# Patient Record
Sex: Male | Born: 1965
Health system: Southern US, Community
[De-identification: ages and names within clinical notes are randomized; demographics above are authoritative.]

## PROBLEM LIST (undated history)

## (undated) DIAGNOSIS — E785 Hyperlipidemia, unspecified: Secondary | ICD-10-CM

## (undated) DIAGNOSIS — I10 Essential (primary) hypertension: Secondary | ICD-10-CM

## (undated) DIAGNOSIS — R740 Nonspecific elevation of levels of transaminase and lactic acid dehydrogenase [LDH]: Secondary | ICD-10-CM

## (undated) DIAGNOSIS — G43909 Migraine, unspecified, not intractable, without status migrainosus: Secondary | ICD-10-CM

## (undated) DIAGNOSIS — G4730 Sleep apnea, unspecified: Secondary | ICD-10-CM

## (undated) DIAGNOSIS — T7840XA Allergy, unspecified, initial encounter: Secondary | ICD-10-CM

## (undated) DIAGNOSIS — K219 Gastro-esophageal reflux disease without esophagitis: Secondary | ICD-10-CM

## (undated) HISTORY — DX: Migraine, unspecified, not intractable, without status migrainosus: G43.909

## (undated) HISTORY — DX: Gastro-esophageal reflux disease without esophagitis: K21.9

## (undated) HISTORY — DX: Essential (primary) hypertension: I10

## (undated) HISTORY — DX: Hyperlipidemia, unspecified: E78.5

## (undated) HISTORY — PX: OTHER SURGICAL HISTORY: SHX169

## (undated) HISTORY — DX: Sleep apnea, unspecified: G47.30

## (undated) HISTORY — DX: Nonspecific elevation of levels of transaminase and lactic acid dehydrogenase (ldh): R74.0

## (undated) HISTORY — DX: Allergy, unspecified, initial encounter: T78.40XA

---

## 1999-06-09 ENCOUNTER — Emergency Department (HOSPITAL_COMMUNITY): Admission: EM | Admit: 1999-06-09 | Discharge: 1999-06-09 | Payer: Self-pay | Admitting: Emergency Medicine

## 1999-11-14 DIAGNOSIS — K219 Gastro-esophageal reflux disease without esophagitis: Secondary | ICD-10-CM | POA: Insufficient documentation

## 2003-06-12 ENCOUNTER — Emergency Department (HOSPITAL_COMMUNITY): Admission: EM | Admit: 2003-06-12 | Discharge: 2003-06-13 | Payer: Self-pay

## 2003-06-13 ENCOUNTER — Encounter: Payer: Self-pay | Admitting: Emergency Medicine

## 2003-06-16 ENCOUNTER — Emergency Department (HOSPITAL_COMMUNITY): Admission: EM | Admit: 2003-06-16 | Discharge: 2003-06-16 | Payer: Self-pay | Admitting: Emergency Medicine

## 2007-12-04 ENCOUNTER — Ambulatory Visit: Payer: Self-pay | Admitting: Internal Medicine

## 2007-12-04 DIAGNOSIS — G43909 Migraine, unspecified, not intractable, without status migrainosus: Secondary | ICD-10-CM

## 2007-12-04 HISTORY — DX: Migraine, unspecified, not intractable, without status migrainosus: G43.909

## 2007-12-05 ENCOUNTER — Ambulatory Visit: Payer: Self-pay | Admitting: Internal Medicine

## 2007-12-05 LAB — CONVERTED CEMR LAB
ALT: 43 units/L (ref 0–53)
AST: 31 units/L (ref 0–37)
Albumin: 4.2 g/dL (ref 3.5–5.2)
Alkaline Phosphatase: 54 units/L (ref 39–117)
BUN: 15 mg/dL (ref 6–23)
Basophils Absolute: 0 10*3/uL (ref 0.0–0.1)
Basophils Relative: 0.3 % (ref 0.0–1.0)
Bilirubin, Direct: 0.1 mg/dL (ref 0.0–0.3)
CO2: 27 meq/L (ref 19–32)
Calcium: 9.4 mg/dL (ref 8.4–10.5)
Chloride: 105 meq/L (ref 96–112)
Creatinine, Ser: 1.1 mg/dL (ref 0.4–1.5)
Eosinophils Absolute: 0.2 10*3/uL (ref 0.0–0.6)
Eosinophils Relative: 2.5 % (ref 0.0–5.0)
GFR calc Af Amer: 95 mL/min
GFR calc non Af Amer: 79 mL/min
Glucose, Bld: 89 mg/dL (ref 70–99)
HCT: 41.1 % (ref 39.0–52.0)
Hemoglobin: 14.6 g/dL (ref 13.0–17.0)
Lymphocytes Relative: 45.3 % (ref 12.0–46.0)
MCHC: 35.1 g/dL (ref 30.0–36.0)
MCV: 89 fL (ref 78.0–100.0)
Monocytes Absolute: 0.6 10*3/uL (ref 0.2–0.7)
Monocytes Relative: 7.5 % (ref 3.0–11.0)
Neutro Abs: 3.8 10*3/uL (ref 1.4–7.7)
Neutrophils Relative %: 44.4 % (ref 43.0–77.0)
Platelets: 331 10*3/uL (ref 150–400)
Potassium: 3.9 meq/L (ref 3.5–5.1)
RBC: 4.61 M/uL (ref 4.22–5.81)
RDW: 12 % (ref 11.5–14.6)
Sodium: 138 meq/L (ref 135–145)
TSH: 1.48 microintl units/mL (ref 0.35–5.50)
Total Bilirubin: 0.7 mg/dL (ref 0.3–1.2)
Total Protein: 7.1 g/dL (ref 6.0–8.3)
WBC: 8.5 10*3/uL (ref 4.5–10.5)

## 2007-12-10 ENCOUNTER — Telehealth (INDEPENDENT_AMBULATORY_CARE_PROVIDER_SITE_OTHER): Payer: Self-pay | Admitting: *Deleted

## 2007-12-13 ENCOUNTER — Encounter: Admission: RE | Admit: 2007-12-13 | Discharge: 2007-12-13 | Payer: Self-pay | Admitting: Internal Medicine

## 2007-12-20 ENCOUNTER — Encounter: Payer: Self-pay | Admitting: Internal Medicine

## 2008-01-01 ENCOUNTER — Ambulatory Visit: Payer: Self-pay | Admitting: Internal Medicine

## 2008-01-01 LAB — CONVERTED CEMR LAB
Bilirubin Urine: NEGATIVE
Blood in Urine, dipstick: NEGATIVE
Glucose, Urine, Semiquant: NEGATIVE
Ketones, urine, test strip: NEGATIVE
Nitrite: NEGATIVE
Specific Gravity, Urine: 1.02
Urobilinogen, UA: 0.2
WBC Urine, dipstick: NEGATIVE
pH: 7

## 2008-01-02 LAB — CONVERTED CEMR LAB
ALT: 85 units/L — ABNORMAL HIGH (ref 0–53)
AST: 41 units/L — ABNORMAL HIGH (ref 0–37)
Albumin: 4.1 g/dL (ref 3.5–5.2)
Alkaline Phosphatase: 45 units/L (ref 39–117)
BUN: 11 mg/dL (ref 6–23)
Basophils Absolute: 0 10*3/uL (ref 0.0–0.1)
Basophils Relative: 0.3 % (ref 0.0–1.0)
Bilirubin, Direct: 0.1 mg/dL (ref 0.0–0.3)
CO2: 28 meq/L (ref 19–32)
Calcium: 9.4 mg/dL (ref 8.4–10.5)
Chloride: 106 meq/L (ref 96–112)
Cholesterol: 252 mg/dL (ref 0–200)
Creatinine, Ser: 1.1 mg/dL (ref 0.4–1.5)
Direct LDL: 177.8 mg/dL
Eosinophils Absolute: 0.1 10*3/uL (ref 0.0–0.6)
Eosinophils Relative: 1.7 % (ref 0.0–5.0)
GFR calc Af Amer: 95 mL/min
GFR calc non Af Amer: 79 mL/min
Glucose, Bld: 92 mg/dL (ref 70–99)
HCT: 40.3 % (ref 39.0–52.0)
HDL: 49.4 mg/dL (ref >39.0)
Hemoglobin: 13.8 g/dL (ref 13.0–17.0)
Lymphocytes Relative: 23.3 % (ref 12.0–46.0)
MCHC: 34.2 g/dL (ref 30.0–36.0)
MCV: 90 fL (ref 78.0–100.0)
Monocytes Absolute: 1.1 10*3/uL — ABNORMAL HIGH (ref 0.2–0.7)
Monocytes Relative: 12.5 % — ABNORMAL HIGH (ref 3.0–11.0)
Neutro Abs: 5.5 10*3/uL (ref 1.4–7.7)
Neutrophils Relative %: 62.2 % (ref 43.0–77.0)
PSA: 0.83 ng/mL (ref 0.10–4.00)
Platelets: 295 10*3/uL (ref 150–400)
Potassium: 4.5 meq/L (ref 3.5–5.1)
RBC: 4.48 M/uL (ref 4.22–5.81)
RDW: 12.6 % (ref 11.5–14.6)
Sodium: 139 meq/L (ref 135–145)
TSH: 0.57 microintl units/mL (ref 0.35–5.50)
Total Bilirubin: 1.1 mg/dL (ref 0.3–1.2)
Total CHOL/HDL Ratio: 5.1
Total Protein: 7.3 g/dL (ref 6.0–8.3)
Triglycerides: 79 mg/dL (ref 0–149)
VLDL: 16 mg/dL (ref 0–40)
WBC: 8.8 10*3/uL (ref 4.5–10.5)

## 2008-01-03 ENCOUNTER — Encounter: Payer: Self-pay | Admitting: Internal Medicine

## 2008-01-03 LAB — CONVERTED CEMR LAB
HCV Ab: NEGATIVE
Hep B S Ab: NEGATIVE
Hepatitis B Surface Ag: NEGATIVE

## 2008-01-08 ENCOUNTER — Ambulatory Visit: Payer: Self-pay | Admitting: Internal Medicine

## 2008-01-08 DIAGNOSIS — E1169 Type 2 diabetes mellitus with other specified complication: Secondary | ICD-10-CM | POA: Insufficient documentation

## 2008-01-08 DIAGNOSIS — E785 Hyperlipidemia, unspecified: Secondary | ICD-10-CM

## 2008-01-08 DIAGNOSIS — R7401 Elevation of levels of liver transaminase levels: Secondary | ICD-10-CM | POA: Insufficient documentation

## 2008-01-08 DIAGNOSIS — R7402 Elevation of levels of lactic acid dehydrogenase (LDH): Secondary | ICD-10-CM

## 2008-01-08 DIAGNOSIS — R740 Nonspecific elevation of levels of transaminase and lactic acid dehydrogenase [LDH]: Secondary | ICD-10-CM

## 2008-01-08 HISTORY — DX: Hyperlipidemia, unspecified: E78.5

## 2008-01-08 HISTORY — DX: Elevation of levels of lactic acid dehydrogenase (LDH): R74.02

## 2008-01-08 HISTORY — DX: Elevation of levels of liver transaminase levels: R74.01

## 2008-03-31 ENCOUNTER — Ambulatory Visit: Payer: Self-pay | Admitting: Internal Medicine

## 2008-03-31 LAB — CONVERTED CEMR LAB
ALT: 25 units/L (ref 0–53)
AST: 23 units/L (ref 0–37)
Albumin: 3.9 g/dL (ref 3.5–5.2)
Alkaline Phosphatase: 33 units/L — ABNORMAL LOW (ref 39–117)
Bilirubin, Direct: 0.1 mg/dL (ref 0.0–0.3)
Total Bilirubin: 1.3 mg/dL — ABNORMAL HIGH (ref 0.3–1.2)
Total Protein: 7 g/dL (ref 6.0–8.3)

## 2008-04-07 ENCOUNTER — Ambulatory Visit: Payer: Self-pay | Admitting: Internal Medicine

## 2008-12-04 ENCOUNTER — Ambulatory Visit: Payer: Self-pay | Admitting: Internal Medicine

## 2008-12-25 ENCOUNTER — Ambulatory Visit: Admission: RE | Admit: 2008-12-25 | Discharge: 2008-12-25 | Payer: Self-pay | Admitting: Internal Medicine

## 2009-01-07 ENCOUNTER — Telehealth: Payer: Self-pay | Admitting: Internal Medicine

## 2009-01-07 DIAGNOSIS — G4730 Sleep apnea, unspecified: Secondary | ICD-10-CM | POA: Insufficient documentation

## 2009-01-08 ENCOUNTER — Encounter: Payer: Self-pay | Admitting: Internal Medicine

## 2009-04-05 ENCOUNTER — Ambulatory Visit: Payer: Self-pay | Admitting: Internal Medicine

## 2009-04-05 LAB — CONVERTED CEMR LAB
ALT: 66 units/L — ABNORMAL HIGH (ref 0–53)
AST: 51 units/L — ABNORMAL HIGH (ref 0–37)
Albumin: 4.1 g/dL (ref 3.5–5.2)
Alkaline Phosphatase: 45 units/L (ref 39–117)
BUN: 16 mg/dL (ref 6–23)
Basophils Absolute: 0 10*3/uL (ref 0.0–0.1)
Basophils Relative: 0.1 % (ref 0.0–3.0)
Bilirubin Urine: NEGATIVE
Bilirubin, Direct: 0.1 mg/dL (ref 0.0–0.3)
Blood in Urine, dipstick: NEGATIVE
CO2: 23 meq/L (ref 19–32)
Calcium: 9.1 mg/dL (ref 8.4–10.5)
Chloride: 105 meq/L (ref 96–112)
Cholesterol: 307 mg/dL — ABNORMAL HIGH (ref 0–200)
Creatinine, Ser: 1.2 mg/dL (ref 0.4–1.5)
Direct LDL: 245.8 mg/dL
Eosinophils Absolute: 0.1 10*3/uL (ref 0.0–0.7)
Eosinophils Relative: 2.2 % (ref 0.0–5.0)
GFR calc non Af Amer: 85.66 mL/min (ref >60)
Glucose, Bld: 95 mg/dL (ref 70–99)
Glucose, Urine, Semiquant: NEGATIVE
HCT: 40.8 % (ref 39.0–52.0)
HDL: 49.3 mg/dL (ref >39.00)
Hemoglobin: 14.4 g/dL (ref 13.0–17.0)
Ketones, urine, test strip: NEGATIVE
Lymphocytes Relative: 30.6 % (ref 12.0–46.0)
Lymphs Abs: 2.1 10*3/uL (ref 0.7–4.0)
MCHC: 35.3 g/dL (ref 30.0–36.0)
MCV: 90.5 fL (ref 78.0–100.0)
Monocytes Absolute: 0.6 10*3/uL (ref 0.1–1.0)
Monocytes Relative: 8.1 % (ref 3.0–12.0)
Neutro Abs: 4 10*3/uL (ref 1.4–7.7)
Neutrophils Relative %: 59 % (ref 43.0–77.0)
Nitrite: NEGATIVE
Platelets: 279 10*3/uL (ref 150.0–400.0)
Potassium: 4 meq/L (ref 3.5–5.1)
RBC: 4.51 M/uL (ref 4.22–5.81)
RDW: 12.8 % (ref 11.5–14.6)
Sodium: 139 meq/L (ref 135–145)
Specific Gravity, Urine: 1.025
TSH: 1.15 microintl units/mL (ref 0.35–5.50)
Total Bilirubin: 1.1 mg/dL (ref 0.3–1.2)
Total CHOL/HDL Ratio: 6
Total Protein: 7.9 g/dL (ref 6.0–8.3)
Triglycerides: 113 mg/dL (ref 0.0–149.0)
Urobilinogen, UA: 0.2
VLDL: 22.6 mg/dL (ref 0.0–40.0)
WBC Urine, dipstick: NEGATIVE
WBC: 6.8 10*3/uL (ref 4.5–10.5)
pH: 5.5

## 2009-04-19 ENCOUNTER — Ambulatory Visit: Payer: Self-pay | Admitting: Internal Medicine

## 2009-05-21 ENCOUNTER — Telehealth (INDEPENDENT_AMBULATORY_CARE_PROVIDER_SITE_OTHER): Payer: Self-pay | Admitting: *Deleted

## 2009-06-18 ENCOUNTER — Emergency Department (HOSPITAL_COMMUNITY): Admission: EM | Admit: 2009-06-18 | Discharge: 2009-06-18 | Payer: Self-pay | Admitting: Family Medicine

## 2009-07-15 ENCOUNTER — Ambulatory Visit: Payer: Self-pay | Admitting: Internal Medicine

## 2009-07-15 LAB — CONVERTED CEMR LAB
ALT: 42 units/L (ref 0–53)
AST: 27 units/L (ref 0–37)
Albumin: 4.2 g/dL (ref 3.5–5.2)
Alkaline Phosphatase: 44 units/L (ref 39–117)
Bilirubin, Direct: 0 mg/dL (ref 0.0–0.3)
Cholesterol: 289 mg/dL — ABNORMAL HIGH (ref 0–200)
Direct LDL: 231.4 mg/dL
HDL: 39.7 mg/dL (ref >39.00)
Total Bilirubin: 1.4 mg/dL — ABNORMAL HIGH (ref 0.3–1.2)
Total CHOL/HDL Ratio: 7
Total Protein: 8.2 g/dL (ref 6.0–8.3)
Triglycerides: 102 mg/dL (ref 0.0–149.0)
VLDL: 20.4 mg/dL (ref 0.0–40.0)

## 2009-07-29 ENCOUNTER — Ambulatory Visit: Payer: Self-pay | Admitting: Internal Medicine

## 2009-09-09 ENCOUNTER — Ambulatory Visit: Payer: Self-pay | Admitting: Internal Medicine

## 2009-09-10 ENCOUNTER — Telehealth: Payer: Self-pay | Admitting: Internal Medicine

## 2009-09-16 LAB — CONVERTED CEMR LAB
ALT: 33 units/L (ref 0–53)
AST: 26 units/L (ref 0–37)
Albumin: 3.8 g/dL (ref 3.5–5.2)
Alkaline Phosphatase: 41 units/L (ref 39–117)
Bilirubin, Direct: 0 mg/dL (ref 0.0–0.3)
Cholesterol: 300 mg/dL — ABNORMAL HIGH (ref 0–200)
Direct LDL: 229.3 mg/dL
HDL: 30.9 mg/dL — ABNORMAL LOW (ref >39.00)
Total Bilirubin: 1 mg/dL (ref 0.3–1.2)
Total CHOL/HDL Ratio: 10
Total Protein: 7.5 g/dL (ref 6.0–8.3)
Triglycerides: 157 mg/dL — ABNORMAL HIGH (ref 0.0–149.0)
VLDL: 31.4 mg/dL (ref 0.0–40.0)

## 2010-04-06 ENCOUNTER — Ambulatory Visit: Payer: Self-pay | Admitting: Internal Medicine

## 2010-04-07 LAB — CONVERTED CEMR LAB
ALT: 86 units/L — ABNORMAL HIGH (ref 0–53)
AST: 60 units/L — ABNORMAL HIGH (ref 0–37)
Albumin: 4.4 g/dL (ref 3.5–5.2)
Alkaline Phosphatase: 49 units/L (ref 39–117)
Bilirubin, Direct: 0.1 mg/dL (ref 0.0–0.3)
Cholesterol: 245 mg/dL — ABNORMAL HIGH (ref 0–200)
Direct LDL: 169.9 mg/dL
HDL: 47.9 mg/dL (ref >39.00)
TSH: 1.25 microintl units/mL (ref 0.35–5.50)
Total Bilirubin: 1.1 mg/dL (ref 0.3–1.2)
Total CHOL/HDL Ratio: 5
Total Protein: 8.1 g/dL (ref 6.0–8.3)
Triglycerides: 224 mg/dL — ABNORMAL HIGH (ref 0.0–149.0)
VLDL: 44.8 mg/dL — ABNORMAL HIGH (ref 0.0–40.0)

## 2010-04-08 ENCOUNTER — Telehealth: Payer: Self-pay | Admitting: Internal Medicine

## 2010-07-11 ENCOUNTER — Ambulatory Visit: Payer: Self-pay | Admitting: Internal Medicine

## 2010-07-11 LAB — CONVERTED CEMR LAB
ALT: 52 units/L (ref 0–53)
AST: 36 units/L (ref 0–37)
Albumin: 4.4 g/dL (ref 3.5–5.2)
Alkaline Phosphatase: 51 units/L (ref 39–117)
Bilirubin, Direct: 0.2 mg/dL (ref 0.0–0.3)
Cholesterol: 204 mg/dL — ABNORMAL HIGH (ref 0–200)
Direct LDL: 158.3 mg/dL
HCV Ab: NEGATIVE
HDL: 39.9 mg/dL (ref >39.00)
Hep B C IgM: NEGATIVE
Hepatitis B Surface Ag: NEGATIVE
Total Bilirubin: 1.2 mg/dL (ref 0.3–1.2)
Total CHOL/HDL Ratio: 5
Total Protein: 7.8 g/dL (ref 6.0–8.3)
Triglycerides: 132 mg/dL (ref 0.0–149.0)
VLDL: 26.4 mg/dL (ref 0.0–40.0)

## 2010-11-13 DIAGNOSIS — I10 Essential (primary) hypertension: Secondary | ICD-10-CM | POA: Insufficient documentation

## 2010-11-13 DIAGNOSIS — E1159 Type 2 diabetes mellitus with other circulatory complications: Secondary | ICD-10-CM | POA: Insufficient documentation

## 2010-11-13 DIAGNOSIS — I152 Hypertension secondary to endocrine disorders: Secondary | ICD-10-CM | POA: Insufficient documentation

## 2010-12-15 NOTE — Assessment & Plan Note (Signed)
Summary: fup//ccm   Vital Signs:  Patient profile:   45 year old male Weight:      229 pounds BMI:     27.25 Temp:     98.4 degrees F oral Pulse rate:   60 / minute Pulse rhythm:   regular Resp:     12 per minute BP sitting:   136 / 88  (left arm) Cuff size:   regular  Vitals Entered By: Gladis Riffle, RN (Apr 06, 2010 11:44 AM) CC: c/o difficulty sleeping --needs refill omeprazole Is Patient Diabetic? No   CC:  c/o difficulty sleeping --needs refill omeprazole.  History of Present Illness:  Follow-Up Visit      This is a 45 year old man who presents for Follow-up visit.  The patient denies chest pain and palpitations.  Since the last visit the patient notes no new problems or concerns.  The patient reports not taking meds as prescribed.  When questioned about possible medication side effects, the patient notes none.   intermittent use of statin  insomnia---relates to "can't turn my brain off". Says he is stressed with job, home, money. he eats late and frequently through the night  All other systems reviewed and were negative   Preventive Screening-Counseling & Management  Alcohol-Tobacco     Smoking Status: quit     Year Quit: 10/08  Current Problems (verified): 1)  Sleep Apnea  (ICD-780.57) 2)  Preventive Health Care  (ICD-V70.0) 3)  Transaminases, Serum, Elevated  (ICD-790.4) 4)  Hyperlipidemia  (ICD-272.4) 5)  Headache  (ICD-784.0)  Current Medications (verified): 1)  Midrin 325-65-100 Mg  Caps (Apap-Isometheptene-Dichloral) .... Take 2 At Onset of Headache Then Every Hour As Needed 2)  Omeprazole 20 Mg Cpdr (Omeprazole) .... Take 1 Tablet By Mouth Once A Day 3)  Nasonex 50 Mcg/act Susp (Mometasone Furoate) .... As Needed 4)  Frova 2.5 Mg Tabs (Frovatriptan Succinate) .... Take One Tab Once Daily 5)  Xyzal 5 Mg Tabs (Levocetirizine Dihydrochloride) .... One Tab At Bedtime 6)  Simvastatin 40 Mg Tabs (Simvastatin) .... 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   Lungs:  Normal respiratory effort, chest expands symmetrically. Lungs are clear to auscultation, no crackles or wheezes. Heart:  normal rate and regular rhythm.     Impression & Recommendations:  Problem # 1:  HYPERLIPIDEMIA (ICD-272.4) discussed he needs treatment side effects discussed f/u 3 months His updated medication list for this problem includes:    Simvastatin 40 Mg Tabs (Simvastatin) .Marland Kitchen... Take 1 tablet by mouth at bedtime  Orders: Venipuncture ([address]) TLB-Lipid Panel (80061-LIPID) TLB-Hepatic/Liver Function Pnl (80076-HEPATIC) TLB-TSH (Thyroid Stimulating Hormone) (84443-TSH)  Labs Reviewed: SGOT: 26 (09/09/2009)   SGPT: 33 (09/09/2009)   HDL:30.90 (09/09/2009), 39.70 (07/15/2009)  LDL:DEL (01/01/2008)  Chol:300 (09/09/2009), 289 (07/15/2009)  Trig:157.0 (09/09/2009), 102.0 (07/15/2009)  Complete Medication List: 1)  Midrin 325-65-100 Mg Caps (Apap-isometheptene-dichloral) .... Take 2 at onset of headache then every hour as needed 2)  Omeprazole 20 Mg Cpdr (Omeprazole) .... Take 1 tablet by mouth once a day 3)  Nasonex  50 Mcg/act Susp (Mometasone furoate) .... As needed 4)  Frova 2.5 Mg Tabs (Frovatriptan succinate) .... Take one tab once daily 5)  Xyzal 5 Mg Tabs (Levocetirizine dihydrochloride) .... One tab at bedtime 6)  Simvastatin 40 Mg Tabs (Simvastatin) .... Take 1 tablet by mouth at bedtime 7)  Imipramine Hcl 10 Mg Tabs (Imipramine hcl) .... Take one tab by mouth at bedtime as needed Prescriptions: OMEPRAZOLE 20 MG CPDR (OMEPRAZOLE) Take 1 tablet by mouth once a day  #90 x 3   Entered and Authorized by:   Birdie Sons MD   Signed by:   Birdie Sons MD on 04/06/2010   Method used:   Electronically to        [facility]  [address] (retail)       [address]       [place], [place]  [address]       Ph: [phone]       Fax: [phone]   RxID:   [id number]

## 2010-12-15 NOTE — Assessment & Plan Note (Signed)
Summary: migraines/ccm   Vital Signs:  Patient Profile:   45 Years Old Male Temp:     98 degrees F 0 Pulse rate:   68 / minute Resp:     14 per minute BP sitting:   122 / 60                 Chief Complaint:  Headache.  History of Present Illness: see note from yesterday.  Patient comes in with a headache.  He did try Relpax.  No relief with the Relpax.  He states headache today is right sided.  It's fairly intense.  It's a sharp ache.  It's very typical of his previous headaches.  He has some photophobia but no visual loss.  No other neurologic deficits.  No other concerning signs or symptoms.  He denies any other associated signs or symptoms..  He denies any modifying factors.  see note from yesterday regarding past medical history, family history, social history.       Review of Systems       no other complaints in a complete ROS    Physical Exam  General:     Well-developed,well-nourished,in no acute distress; alert,appropriate and cooperative throughout examination Eyes:     pupils equal, pupils round, pupils reactive to light, pupils react to accomodation, corneas and lenses clear, and no injection.   Neck:     No deformities, masses, or tenderness noted. Chest Wall:     No deformities, masses, tenderness or gynecomastia noted.    Impression & Recommendations:  Problem # 1:  COMMON MIGRAINE (ICD-346.10) I think the patient has migraine headaches.  Will try prophylactic medications.  Will try diclofenac. he will call if his symptoms persist.  He will need imaging studies of his symptoms persist. His updated medication list for this problem includes:    Relpax 40 Mg Tabs (Eletriptan hydrobromide) .Marland Kitchen... 1 by mouth at onset of headache  His updated medication list for this problem includes:    Relpax 40 Mg Tabs (Eletriptan hydrobromide) .Marland Kitchen... 1 by mouth at onset of headach   Complete Medication List: 1)  Relpax 40 Mg Tabs (Eletriptan hydrobromide) .Marland Kitchen.. 1  by mouth at onset of headach     ]  Appended Document: migraines/ccm Called pt to see how he was doing.  States still has headache and was in bed all yesterday, is slightly better today but has nagging pain.  Told pt Dr Cato Mulligan would recommend CT head and pt ok with that.  Will schedule CT head w and w/o contrast.

## 2010-12-15 NOTE — Progress Notes (Signed)
Summary: RESULTS OF SLEEP STUDY   Phone Note Call from Patient Call back at [phone]   Caller: PT LIVE Call For: Birdie Sons MD Summary of Call: WOULD LIKE RESULTS OF THE SLEEP STUDY HE HAD DONE.   Initial call taken by: Roselle Locus,  January 07, 2009 1:37 PM  Follow-up for Phone Call        pt given results.  Will await to hear from [facility] as to what to do and when etc.  (see sleep study report scanned to chart.) Follow-up by: Gladis Riffle, RN,  January 07, 2009 2:26 PM  New Problems: SLEEP APNEA (ICD-780.57)   New Problems: SLEEP APNEA (ICD-780.57)

## 2010-12-15 NOTE — Assessment & Plan Note (Signed)
Summary: CPX/CCM   Vital Signs:  Patient Profile:   45 Years Old Male Height:     76 inches Weight:      197 pounds Temp:     98.6 degrees F Pulse rate:   64 / minute BP sitting:   112 / 70  (left arm)  Vitals Entered By: Gladis Riffle, RN (January 08, 2008 10:29 AM)                 Chief Complaint:  cpx and labs done--states headaches are better controlled with midrin.  History of Present Illness: CPX    Current Allergies: No known allergies   Past Medical History:    Reviewed history from 12/04/2007 and no changes required:       Headache       Hyperlipidemia       elevated LFT---HEP b and C negative  Past Surgical History:    Reviewed history from 12/04/2007 and no changes required:       Denies surgical history   Family History:    Reviewed history from 12/04/2007 and no changes required:       father-unknown heatlh       mother with htn       Family History Hypertension-mother       Family History High cholesterol-mother  Social History:    Reviewed history from 12/04/2007 and no changes required:       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  manufacturer: Sheran Lawless   exp:   lot#:   Problem # 2:  HYPERLIPIDEMIA (ICD-272.4)  Labs Reviewed: Chol: 252 (01/01/2008)   HDL: 49.4 (01/01/2008)   LDL: DEL (01/01/2008)   TG: 79 (01/01/2008) SGOT: 41 (01/01/2008)   SGPT: 85 (01/01/2008)   Problem # 3:  TRANSAMINASES, SERUM, ELEVATED (ICD-790.4)  Complete Medication List: 1)  Midrin 325-65-100 Mg Caps (Apap-isometheptene-dichloral) .... Take 2 at onset of headache then every hour as needed  Other Orders: Admin 1st Vaccine ([address]) Tdap => 26yrs IM  (60454)   Patient Instructions: 1)  Please schedule a follow-up appointment in 3 months. 2)  Hepatic Panel prior to visit, ICD-9:---790.4    ]  Physical Exam General Appearance: well developed, well nourished, no acute distress Eyes: conjunctiva and lids normal, PERRLA, EOMI, fundi WNL Ears, Nose, Mouth, Throat: TM clear, nares clear, oral exam WNL Neck: supple, no lymphadenopathy, no thyromegaly, no JVD Respiratory: clear to auscultation and percussion, respiratory effort normal Cardiovascular: regular rate and rhythm, S1-S2, no murmur, rub or gallop, no bruits, peripheral pulses normal and symmetric, no cyanosis, clubbing, edema or varicosities Chest: no scars, masses, tenderness; no asymmetry, skin changes, nipple discharge, no gynecomastia   Gastrointestinal: soft, non-tender; no hepatosplenomegaly, masses; active bowel sounds all quadrants,  Lymphatic: no cervical, axillary or inguinal adenopathy Musculoskeletal: gait normal, muscle tone and strength WNL, no joint swelling, effusions, discoloration, crepitus  Skin: clear, good turgor, color WNL, no rashes, lesions, or ulcerations Neurologic: normal mental status, normal reflexes, normal strength, sensation, and motion Psychiatric: alert; oriented  to person, place and time Other Exam:   Appended Document: CPX/CCM exp:  Dec 19, 2009 lot:  [id number] given IM left deltoid

## 2010-12-15 NOTE — Assessment & Plan Note (Signed)
Summary: sinus infection/dm   Vital Signs:  Patient Profile:   45 Years Old Male Height:     76 inches Temp:     97.9 degrees F Pulse rate:   76 / minute BP sitting:   156 / 98  (left arm)  Vitals Entered By: Gladis Riffle, RN (December 04, 2008 11:09 AM)                 Chief Complaint:  c/o sinus pain, sore throat, and snoring at night.  History of Present Illness: sinus congestion and ST for 4 days no fever or chills no associated sxs no SOB st pain 4/10  he alos complains of sleep issues: states that he has been told that snoring is worse and wife tells him that he quits breathing at night. Sometimes he feels like throat "swells Up". 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 healthy Former Smoker Alcohol use-no Regular exercise-no hx of drug abuse (marijuana, cocaine, alcoholic)  Family History: father-unknown heatlh mother with htn Family History Hypertension-mother Family History High cholesterol-mother  no other complaints in a complete ROS     Prior Medication List:  MIDRIN 325-65-100 MG  CAPS (APAP-ISOMETHEPTENE-DICHLORAL) take 2 at onset of headache then every hour as needed FEXOFENADINE HCL 180 MG  TABS (FEXOFENADINE HCL) once daily as needed PROMETHAZINE HCL 25 MG  TABS (PROMETHAZINE HCL) take one every 8 hours as needed for nausea and vomiting   Current Allergies (reviewed today): No known allergies       Physical Exam  General:     Well-developed,well-nourished,in no acute distress; alert,appropriate and cooperative throughout examination Head:     normocephalic and atraumatic.   Eyes:     pupils equal and pupils round.   Ears:     R ear normal and L ear normal.   Nose:     no external deformity and no external erythema.   Neck:     No deformities,  masses, or tenderness noted. Chest Wall:     No deformities, masses, tenderness or gynecomastia noted. Lungs:     Normal respiratory effort, chest expands symmetrically. Lungs are clear to auscultation, no crackles or wheezes. Heart:     Normal rate and regular rhythm. S1 and S2 normal without gallop, murmur, click, rub or other extra sounds. Abdomen:     Bowel sounds positive,abdomen soft and non-tender without masses, organomegaly or hernias noted.    Impression & Recommendations:  Problem # 1:  URI (ICD-465.9) resume below meds after 5 days of Allfen two times a day (samples given and side effects discussed) His updated medication list for this problem includes:    Fexofenadine Hcl 180 Mg Tabs (Fexofenadine hcl) ..... Once daily as needed    Promethazine Hcl 25 Mg Tabs (Promethazine hcl) .Marland Kitchen... Take one every 8 hours as needed for nausea and vomiting  side efects discussed   Problem # 2:  HYPERSOMNIA (ICD-780.54) needs further evaluation sleep study Orders: Split Night (Split Night)   Complete Medication List: 1)  Midrin 325-65-100 Mg Caps (Apap-isometheptene-dichloral) .... Take 2 at onset of headache then every hour as needed 2)  Fexofenadine Hcl 180 Mg Tabs (Fexofenadine hcl) .... Once daily as needed 3)  Promethazine Hcl 25 Mg Tabs (Promethazine hcl) .... Take one every 8 hours as needed for nausea and vomiting

## 2010-12-15 NOTE — Assessment & Plan Note (Signed)
Summary: cpx/njr/PT RESCD FROM BUMP//CCM   Vital Signs:  Patient profile:   45 year old male Height:      77 inches Weight:      221 pounds BMI:     26.30 Pulse rate:   80 / minute Pulse rhythm:   regular Resp:     12 per minute BP sitting:   130 / 96  (left arm)  Vitals Entered By: Gladis Riffle, RN (April 19, 2009 9:22 AM)  History of Present Illness: cpx    Current Problems (verified): 1)  Sleep Apnea  (ICD-780.57) 2)  Preventive Health Care  (ICD-V70.0) 3)  Transaminases, Serum, Elevated  (ICD-790.4) 4)  Hyperlipidemia  (ICD-272.4) 5)  Headache  (ICD-784.0)  Current Medications (verified): 1)  Midrin 325-65-100 Mg  Caps (Apap-Isometheptene-Dichloral) .... Take 2 At Onset of Headache Then Every Hour As Needed 2)  Fexofenadine Hcl 180 Mg  Tabs (Fexofenadine Hcl) .... Once Daily As Needed 3)  Promethazine Hcl 25 Mg  Tabs (Promethazine Hcl) .... Take One Every 8 Hours As Needed For Nausea and Vomiting  Allergies (verified): No Known Drug Allergies  Comments:  Nurse/Medical Assistant: cpx, labs done--c/o lack of sleep and "twinge" right lower abdomen The patient's medications and allergies were reviewed with the patient and were updated in the Medication and Allergy Lists. 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 Married 2 kids healthy Former Smoker Alcohol use-no Regular exercise-no hx of drug abuse (marijuana, cocaine, alcoholic)  Risk Factors: Smoking Status: quit (12/04/2007)  Social History: Occupation: Sales promotion account executive Married 2 kids healthy Former Smoker Alcohol use-no Regular exercise-no hx of drug abuse (marijuana, cocaine, alcoholic)  Review of Systems       All other systems reviewed and were negative    Impression & Recommendations:  Problem # 1:  PREVENTIVE HEALTH CARE (ICD-V70.0) health maint UTD  Problem # 2:  HEADACHE (ICD-784.0) rare use of midrin His updated medication list for this problem includes:    Midrin 325-65-100 Mg Caps (Apap-isometheptene-dichloral) .Marland Kitchen... Take 2 at onset of headache then every hour as needed  Problem # 3:  TRANSAMINASES, SERUM, ELEVATED (ICD-790.4) likely related to hyperlipidemia and steatohepatitis  Problem # 4:  HYPERLIPIDEMIA (ICD-272.4) remakably high and elevated he will try diet and exercise modification for 3 months check than.   Complete Medication List: 1)  Midrin 325-65-100 Mg Caps (Apap-isometheptene-dichloral) .... Take 2 at onset of headache then every hour as needed 2)  Fexofenadine Hcl 180 Mg Tabs (Fexofenadine hcl) .... Once daily as needed 3)  Promethazine Hcl 25 Mg Tabs (Promethazine hcl) .... Take one every 8 hours as needed for nausea and vomiting  Preventive Care Screening  Last Tetanus Booster:    Date:  12/15/2007    Results:  Tdap    Patient Instructions: 1)  Please schedule a follow-up appointment in 3 months. 2)  lipids 272.4 3)  liver 995.2 4)  It is important that you exercise regularly at least 40 minutes 5 times a week. If you develop chest pain, have severe difficulty breathing, or feel very tired , stop exercising immediately and seek medical attention. Physical Exam General Appearance: well developed, well nourished,  no acute distress Eyes: conjunctiva and lids normal, PERRL, EOMI Ears, Nose, Mouth, Throat: TM clear, nares clear, oral exam WNL Neck: supple, no lymphadenopathy, no thyromegaly, no JVD Respiratory: clear to auscultation and percussion, respiratory effort normal Cardiovascular: regular  rate and rhythm, S1-S2, no murmur, rub or gallop, no bruits, peripheral pulses normal and symmetric, no cyanosis, clubbing, edema or varicosities Chest: no scars, masses, tenderness; no asymmetry, skin changes, nipple discharge, no gynecomastia   Gastrointestinal: soft, non-tender; no hepatosplenomegaly, masses; active bowel sounds all quadrants, uaiac negative stool; no masses, tenderness, hemorrhoids  Genitourinary: no hernia, testicular mass, or prostate enlargement Lymphatic: no cervical, axillary or inguinal adenopathy Musculoskeletal: gait normal, muscle tone and strength WNL, no joint swelling, effusions, discoloration, crepitus  Skin: clear, good turgor, color WNL, no rashes, lesions, or ulcerations Neurologic: normal mental status, normal reflexes, normal strength, sensation, and motion Psychiatric: alert; oriented to person, place and time Other Exam:

## 2010-12-15 NOTE — Assessment & Plan Note (Signed)
Summary: new pt to establish //ca   Vital Signs:  Patient Profile:   45 Years Old Male Height:     77.25 inches Weight:      201 pounds Temp:     98.3 degrees F Pulse rate:   74 / minute BP sitting:   139 / 85                 Chief Complaint:  Headache.  History of Present Illness: Complains of headache-had a nagging headache (wax and wane)---duration 2 months. Past several weeks he has had a left sided headache---throbbing, sharp pain. Fels just like other headaches he has had over the past 20 years. He was given an Rx for fiorinal----treated by allergist. He was also emperically treated for a sinus infection by allergist---given avelox    Past Medical History:    Headache  Past Surgical History:    Denies surgical history   Family History:    father-unknown heatlh    mother with htn    Family History Hypertension-mother  Social History:    Occupation: Systems developer    Married    2 kids healthy    Former Smoker    Alcohol use-no    Regular exercise-no   Risk Factors:  Tobacco use:  quit    Year quit:  10/08 Alcohol use:  no Exercise:  no   Review of Systems       no other complaints in a complete ROS    Physical Exam  General:     Well-developed,well-nourished,in no acute distress; alert,appropriate and cooperative throughout examination Head:     normocephalic, atraumatic, and no abnormalities observed.   Eyes:     vision grossly intact, pupils equal, pupils round, pupils reactive to light, pupils react to accomodation, corneas and lenses clear, no injection, no optic disk abnormalities, and no retinal abnormalitiies.   Ears:     R ear normal, L ear normal, and no external deformities.   Nose:     no external deformity and nose piercing noted.   Neck:     No deformities, masses, or tenderness noted. Chest Wall:     no deformities and no tenderness.   Lungs:     Normal respiratory effort, chest expands symmetrically. Lungs are clear to  auscultation, no crackles or wheezes. Heart:     Normal rate and regular rhythm. S1 and S2 normal without gallop, murmur, click, rub or other extra sounds. Abdomen:     Bowel sounds positive,abdomen soft and non-tender without masses, organomegaly or hernias noted. Msk:     No deformity or scoliosis noted of thoracic or lumbar spine.   Pulses:     R radial normal and L radial normal.   Extremities:     No clubbing, cyanosis, edema, or deformity noted  Neurologic:     alert & oriented X3 and gait normal.   Skin:     Intact without suspicious lesions or rashes Cervical Nodes:     no anterior cervical adenopathy and no posterior cervical adenopathy.   Psych:     good eye contact and not anxious appearing.      Impression & Recommendations:  Problem # 1:  HEADACHE (ICD-784.0) patient gives a good history for migraine headaches.  These have been ongoing for years seem to follow a fluctuating pattern.  He's never tried a trip 88.  I've given him samples of Relpax.  I've discussed all side effects.  If his symptoms become more frequent  or persistent he may need a prophylactic medication to help prevent migraine headaches.  He will call me for any change in symptoms.  He'll call me for any neurologic deficits will call me for any other concerns.  He will follow-up with me in one month. His updated medication list for this problem includes:    Relpax 40 Mg Tabs (Eletriptan hydrobromide) .Marland Kitchen... 1 by mouth at onset of headach  Orders: Venipuncture (16109) TLB-BMP (Basic Metabolic Panel-BMET) (80048-METABOL) TLB-CBC Platelet - w/Differential (85025-CBCD) TLB-Hepatic/Liver Function Pnl (80076-HEPATIC) TLB-TSH (Thyroid Stimulating Hormone) (84443-TSH)   Complete Medication List: 1)  Relpax 40 Mg Tabs (Eletriptan hydrobromide) .Marland Kitchen.. 1 by mouth at onset of headach     ]Physical Exam General Appearance: well developed, well nourished, no acute distress Eyes: conjunctiva and lids normal,  PERRL, EOMI, fundi WNL Ears, Nose, Mouth, Throat: TM clear, nares clear, oral exam WNL Neck: supple, no lymphadenopathy, no thyromegaly, no JVD Respiratory: clear to auscultation and percussion, respiratory effort normal Cardiovascular: regular rate and rhythm, S1-S2, no murmur, rub or gallop, no bruits, peripheral pulses normal and symmetric, no cyanosis, clubbing, edema or varicosities Chest: no scars, masses, tenderness; no asymmetry, skin changes, nipple discharge, no gynecomastia   Gastrointestinal: soft, non-tender; no hepatosplenomegaly, masses; active bowel sounds all quadrants,  Genitourinary: no hernia, testicular mass, or prostate enlargement Lymphatic: no cervical, axillary or inguinal adenopathy Musculoskeletal: gait normal, muscle tone and strength WNL, no joint swelling, effusions, discoloration, crepitus  Skin: clear, good turgor, color WNL, no rashes, lesions, or ulcerations Neurologic: normal mental status, normal reflexes, normal strength, sensation, and motion Psychiatric: alert; oriented to person, place and time

## 2010-12-15 NOTE — Progress Notes (Signed)
Summary: Pt req sleep med. Pt has lab results. [facility] in [place]  Phone Note Call from Patient Call back at [facility] Phone [phone]   Caller: Patient Summary of Call: Pt is req a med for sleeping. Pt has gotten lab results.  Pls call in to [facility] in [place]. Initial call taken by: Lucy Antigua,  Apr 08, 2010 8:51 AM  Follow-up for Phone Call        imipramine 10 mg by mouth at bedtime as needed #20/3 Follow-up by: Birdie Sons MD,  Apr 08, 2010 8:55 AM  Additional Follow-up for Phone Call Additional follow up Details #1::        rx sent .  patient is aware Additional Follow-up by: Kern Reap CMA Duncan Dull),  Apr 08, 2010 10:02 AM    New/Updated Medications: IMIPRAMINE HCL 10 MG TABS (IMIPRAMINE HCL) take one tab by mouth at bedtime as needed Prescriptions: IMIPRAMINE HCL 10 MG TABS (IMIPRAMINE HCL) take one tab by mouth at bedtime as needed  #20 x 3   Entered by:   Kern Reap CMA (AAMA)   Authorized by:   Birdie Sons MD   Signed by:   Kern Reap CMA (AAMA) on 04/08/2010   Method used:   Electronically to        [facility]  [address] (retail)       [address]       [place], [place]  [address]       Ph: [phone]       Fax: [phone]   RxID:   [id number]

## 2010-12-15 NOTE — Assessment & Plan Note (Signed)
Summary: 3 MONTH FUP//CCM/pt rescd//ccm   Vital Signs:  Patient profile:   45 year old male Weight:      214 pounds BMI:     25.47 Temp:     98.3 degrees F oral BP sitting:   120 / 84  (left arm) Cuff size:   regular  Vitals Entered By: Gladis Riffle, RN (July 29, 2009 12:05 PM)  Reason for Visit follow up labs  History of Present Illness:  Hyperlipidemia Follow-Up      This is a 45 year old man who presents for Hyperlipidemia follow-up.  The patient denies GI upset, abdominal pain, flushing, itching, and constipation.  The patient denies the following symptoms: chest pain/pressure, exercise intolerance, dypsnea, palpitations, syncope, and pedal edema.  Dietary compliance has been good.  The patient reports exercising occasionally.  Adjunctive measures currently used by the patient include fiber.   currently on no meds All other systems reviewed and were negative   Current Problems (verified): 1)  Sleep Apnea  (ICD-780.57) 2)  Preventive Health Care  (ICD-V70.0) 3)  Transaminases, Serum, Elevated  (ICD-790.4) 4)  Hyperlipidemia  (ICD-272.4) 5)  Headache  (ICD-784.0)  Allergies (verified): No Known Drug Allergies  Past History:  Past Medical History: Last updated: 01/08/2008 Headache Hyperlipidemia elevated LFT---HEP b and C negative  Past Surgical History: Last updated: 12/04/2007 Denies surgical history  Family History: Last updated: 01/08/2008 father-unknown heatlh mother with htn Family History Hypertension-mother Family History High cholesterol-mother  Social History: Last updated: 04/19/2009 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   Neck:  supple and full ROM.   Skin:  turgor normal and color normal.   Psych:  good eye contact and not anxious appearing.     Impression & Recommendations:  Problem # 1:  HYPERLIPIDEMIA (ICD-272.4) discussed at length needs treatment simvastatin side effects discussed data supporting treatment discussed Labs Reviewed: SGOT: 27 (07/15/2009)   SGPT: 42 (07/15/2009)   HDL:39.70 (07/15/2009), 49.30 (04/05/2009)  LDL:DEL (01/01/2008)  Chol:289 (07/15/2009), 307 (04/05/2009)  Trig:102.0 (07/15/2009), 113.0 (04/05/2009)  Problem # 2:  TRANSAMINASES, SERUM, ELEVATED (ICD-790.4) improved  Complete Medication List: 1)  Midrin 325-65-100 Mg Caps (Apap-isometheptene-dichloral) .... Take 2 at onset of headache then every hour as needed 2)  Fexofenadine Hcl 180 Mg Tabs (Fexofenadine hcl) .... Once daily as needed 3)  Promethazine Hcl 25 Mg Tabs (Promethazine hcl) .... Take one every 8 hours as needed for nausea and vomiting 4)  Omeprazole 20 Mg Cpdr (Omeprazole) .... Take 1 tablet by mouth once a day 5)  Afrin Nasal Spray 0.05 % Soln (Oxymetazoline hcl) .... As needed 6)  Nasonex 50 Mcg/act Susp (Mometasone furoate) .... As needed 7)  Frova 2.5 Mg Tabs (Frovatriptan succinate) .... Take one tab once daily 8)  Xyzal 5 Mg Tabs (Levocetirizine dihydrochloride) .... One tab at bedtime  Patient Instructions: 1)  lipids 272.4 2)  liver 995.2  3)  draw labs in 6 weeks no office visit

## 2010-12-15 NOTE — Progress Notes (Signed)
Summary: migraines no better    needs CT with and without contrast   Phone Note Call from Patient   Caller: Patient Call For: Birdie Sons MD Summary of Call: Pt. states that he is ready for the CT scan and would like something else for headache.........the last meds he got did not help. [facility] [place]...Marland KitchenMarland [phone] Pt. [phone] Ext..[phone] Initial call taken by: Lynann Beaver CMA,  December 10, 2007 10:50 AM  Follow-up for Phone Call        Ct head with or without CM? Follow-up by: Florentina Addison,  December 10, 2007 10:59 AM  Additional Follow-up for Phone Call Additional follow up Details #1::        Patient has Rosann Auerbach Faxed order to [facility]/office will contact patient Additional Follow-up by: Florentina Addison,  December 10, 2007 2:37 PM         Appended Document: migraines no better    needs CT with and without contrast  01/31 @ 1:10

## 2010-12-15 NOTE — Progress Notes (Signed)
Summary: pt req lab results  Phone Note Call from Patient   Caller: Patient Summary of Call: Pt called to get lab results.  Initial call taken by: Lucy Antigua,  September 10, 2009 4:56 PM  Follow-up for Phone Call        [facility] Follow-up by: Lynann Beaver CMA,  September 13, 2009 12:34 PM  Additional Follow-up for Phone Call Additional follow up Details #1::        pt returning call to nurse Additional Follow-up by: Trixie Dredge,  September 14, 2009 11:55 AM    Additional Follow-up for Phone Call Additional follow up Details #2::    Pt called back again to get results. Please call him back at [phone] at work ext [phone]. If you cant get pt when you call, have his work page him.  Follow-up by: Lucy Antigua,  September 14, 2009 4:37 PM  Additional Follow-up for Phone Call Additional follow up Details #3:: Details for Additional Follow-up Action Taken: Pt not at work today.  will try again tomorrow.Gladis Riffle, RN  September 15, 2009 2:37 PM   Patient notified of results--see append to lab report. Additional Follow-up by: Gladis Riffle, RN,  September 16, 2009 10:30 AM

## 2010-12-15 NOTE — Assessment & Plan Note (Signed)
Summary: 3 MONTH ROV/NJR   Vital Signs:  Patient Profile:   45 Years Old Male Height:     76 inches Weight:      195 pounds Pulse rate:   68 / minute BP sitting:   104 / 86  (left arm)  Vitals Entered By: Gladis Riffle, RN (Apr 07, 2008 9:45 AM)                 Chief Complaint:  3 month rov and labs done.  History of Present Illness:  TRANSAMINASES, SERUM, ELEVATED (ICD-790.4)---needs followup HYPERLIPIDEMIA (ICD-272.4)---no treatment COMMON MIGRAINE (ICD-346.10)--rare use of triptan HEADACHE (ICD-784.0)---have not recurred to any significant degree        Current Allergies (reviewed today): No known allergies   Past Medical History:    Reviewed history from 01/08/2008 and no changes required:       Headache       Hyperlipidemia       elevated LFT---HEP b and C negative  Past Surgical History:    Reviewed history from 12/04/2007 and no changes required:       Denies surgical history   Social History:    Occupation: Systems developer    Married    2 kids healthy    Former Smoker    Alcohol use-no    Regular exercise-no    hx of drug abuse (marijuana, cocaine, alcoholic)    Review of Systems       no other complaints in a complete ROS    Physical Exam  General:     Well-developed,well-nourished,in no acute distress; alert,appropriate and cooperative throughout examination Head:     normocephalic and atraumatic.   Eyes:     pupils equal and pupils round.   Ears:     R ear normal and L ear normal.   Neck:     No deformities, masses, or tenderness noted. Chest Wall:     No deformities, masses, tenderness or gynecomastia noted. Lungs:     Normal respiratory effort, chest expands symmetrically. Lungs are clear to auscultation, no crackles or wheezes. Heart:     Normal rate and regular rhythm. S1 and S2 normal without gallop, murmur, click, rub or other extra sounds. Abdomen:     Bowel sounds positive,abdomen soft and non-tender without masses,  organomegaly or hernias noted. Msk:     No deformity or scoliosis noted of thoracic or lumbar spine.   Pulses:     R radial normal and L radial normal.   Extremities:     No clubbing, cyanosis, edema, or deformity noted  Neurologic:     cranial nerves II-XII intact and gait normal.   Psych:     good eye contact and not anxious appearing.      Impression & Recommendations:  Problem # 1:  TRANSAMINASES, SERUM, ELEVATED (ICD-790.4) Assessment: Improved reviewed labCPX in 1 year  Problem # 2:  HYPERLIPIDEMIA (ICD-272.4) no need to f/u or treat at this time Labs Reviewed: Chol: 252 (01/01/2008)   HDL: 49.4 (01/01/2008)   LDL: DEL (01/01/2008)   TG: 79 (01/01/2008) SGOT: 23 (03/31/2008)   SGPT: 25 (03/31/2008)   Problem # 3:  COMMON MIGRAINE (ICD-346.10) essentially resolved---uses rare relpax His updated medication list for this problem includes:    Midrin 325-65-100 Mg Caps (Apap-isometheptene-dichloral) .Marland Kitchen... Take 2 at onset of headache then every hour as needed   Complete Medication List: 1)  Midrin 325-65-100 Mg Caps (Apap-isometheptene-dichloral) .... Take 2 at onset of headache then every hour  as needed 2)  Fexofenadine Hcl 180 Mg Tabs (Fexofenadine hcl) .... Once daily as needed    ]

## 2010-12-15 NOTE — Progress Notes (Signed)
Summary: Acid reflux/needs medication  Phone Note Call from Patient   Summary of Call: Patient is taking Zantac 150 and he has to take it two to three times a day. Patient states he is waking up in the night with acid reflux. Patient requesting something stronger for GERD be called to the pharmacy. [facility]. Patient can be reached at [phone]. Initial call taken by: Darra Lis RMA,  May 21, 2009 10:47 AM  Follow-up for Phone Call        otc omeprazole 20 mg by mouth once daily call if sxs persist stop other GERD meds Follow-up by: Birdie Sons MD,  May 21, 2009 11:36 AM  Additional Follow-up for Phone Call Additional follow up Details #1::        Detailed messages left on patient's voice mail. Additional Follow-up by: Darra Lis RMA,  May 21, 2009 12:40 PM    New/Updated Medications: OMEPRAZOLE 20 MG CPDR (OMEPRAZOLE) Take 1 tablet by mouth once a day   Prescriptions: OMEPRAZOLE 20 MG CPDR (OMEPRAZOLE) Take 1 tablet by mouth once a day  #30 x 3   Entered by:   Darra Lis RMA   Authorized by:   Birdie Sons MD   Signed by:   Darra Lis RMA on 05/21/2009   Method used:   [facility]  [address] [phone]* (retail)       [address]       [place], [place]  [address]       Ph: [phone] or [phone]       Fax: [phone]   RxID:   [id number]

## 2010-12-15 NOTE — Letter (Signed)
Summary: Internal Correspondence  Internal Correspondence   Imported By: Lysle Rubens 01/08/2009 09:59:03  _____________________________________________________________________  External Attachment:    Type:   Image     Comment:   External Document

## 2010-12-15 NOTE — Consult Note (Signed)
Summary: [facility]  [facility]   Imported By: Maryln Gottron 02/12/2008 13:55:53  _____________________________________________________________________  External Attachment:    Type:   Image     Comment:   External Document

## 2010-12-20 ENCOUNTER — Other Ambulatory Visit: Payer: Self-pay | Admitting: Internal Medicine

## 2010-12-20 DIAGNOSIS — E785 Hyperlipidemia, unspecified: Secondary | ICD-10-CM

## 2011-01-20 ENCOUNTER — Ambulatory Visit (INDEPENDENT_AMBULATORY_CARE_PROVIDER_SITE_OTHER): Payer: PRIVATE HEALTH INSURANCE | Admitting: Family Medicine

## 2011-01-20 ENCOUNTER — Encounter: Payer: Self-pay | Admitting: Family Medicine

## 2011-01-20 VITALS — BP 138/96 | Temp 98.4°F | Ht 76.0 in | Wt 242.0 lb

## 2011-01-20 DIAGNOSIS — IMO0001 Reserved for inherently not codable concepts without codable children: Secondary | ICD-10-CM

## 2011-01-20 DIAGNOSIS — R030 Elevated blood-pressure reading, without diagnosis of hypertension: Secondary | ICD-10-CM

## 2011-01-20 NOTE — Progress Notes (Signed)
  Subjective:    Patient ID: Trevor Flowers, male    DOB: [birth year], 45 y.o.   MRN: [id number]  HPI  patient seen with concerns for possible elevated blood pressure. Has not taken his blood pressure. Had several days if not weeks of intermittent headache , nosebleed and generalized fatigue. Does have positive family history of hypertension. No nonsteroidal use. Occasional alcohol use but not regularly.  Ex-smoker. No regular exercise. Takes simvastatin for hyperlipidemia but no other medications. Had some weight gain since quitting smoking several years ago.  Never diagnosed with hypertension.   Review of Systems     Objective:   Physical Exam  patient is alert and in no distress. Blood pressure 138/96 confirmed right and left arm Pupils equal round reactive to light. Fundi no hemorrhages or exudate  nasal exam unremarkable Neck supple no mass Chest clear to auscultation Heart regular rhythm and rate with no murmur  Extremities reveal no edema       Assessment & Plan:   elevated blood pressure. No prior history of hypertension. Discussed lifestyle management with sodium reduction, weight loss, regular aerobic exercise and alcohol in moderation. Recommend followup with primary physician in one month to reassess

## 2011-01-20 NOTE — Patient Instructions (Signed)

## 2011-01-22 ENCOUNTER — Encounter: Payer: Self-pay | Admitting: Family Medicine

## 2011-01-30 ENCOUNTER — Other Ambulatory Visit: Payer: Self-pay | Admitting: Internal Medicine

## 2011-02-15 ENCOUNTER — Other Ambulatory Visit (INDEPENDENT_AMBULATORY_CARE_PROVIDER_SITE_OTHER): Payer: PRIVATE HEALTH INSURANCE | Admitting: Internal Medicine

## 2011-02-15 DIAGNOSIS — Z0000 Encounter for general adult medical examination without abnormal findings: Secondary | ICD-10-CM

## 2011-02-15 LAB — POCT URINALYSIS DIPSTICK
Bilirubin, UA: NEGATIVE
Blood, UA: NEGATIVE
Glucose, UA: NEGATIVE
Ketones, UA: NEGATIVE
Leukocytes, UA: NEGATIVE
Nitrite, UA: NEGATIVE
Protein, UA: NEGATIVE
Spec Grav, UA: 1.02
Urobilinogen, UA: 2
pH, UA: 7

## 2011-02-15 LAB — CBC WITH DIFFERENTIAL/PLATELET
Basophils Absolute: 0.1 10*3/uL (ref 0.0–0.1)
Basophils Relative: 0.8 % (ref 0.0–3.0)
Eosinophils Absolute: 0.3 10*3/uL (ref 0.0–0.7)
Eosinophils Relative: 3.5 % (ref 0.0–5.0)
HCT: 41.8 % (ref 39.0–52.0)
Hemoglobin: 14.5 g/dL (ref 13.0–17.0)
Lymphocytes Relative: 35.4 % (ref 12.0–46.0)
Lymphs Abs: 2.7 10*3/uL (ref 0.7–4.0)
MCHC: 34.8 g/dL (ref 30.0–36.0)
MCV: 91.2 fl (ref 78.0–100.0)
Monocytes Absolute: 0.5 10*3/uL (ref 0.1–1.0)
Monocytes Relative: 6.5 % (ref 3.0–12.0)
Neutro Abs: 4.1 10*3/uL (ref 1.4–7.7)
Neutrophils Relative %: 53.8 % (ref 43.0–77.0)
Platelets: 320 10*3/uL (ref 150.0–400.0)
RBC: 4.58 Mil/uL (ref 4.22–5.81)
RDW: 13.8 % (ref 11.5–14.6)
WBC: 7.6 10*3/uL (ref 4.5–10.5)

## 2011-02-15 LAB — BASIC METABOLIC PANEL
BUN: 16 mg/dL (ref 6–23)
CO2: 27 mEq/L (ref 19–32)
Calcium: 9.3 mg/dL (ref 8.4–10.5)
Chloride: 104 mEq/L (ref 96–112)
Creatinine, Ser: 1.1 mg/dL (ref 0.4–1.5)
GFR: 93.86 mL/min (ref >60.00)
Glucose, Bld: 87 mg/dL (ref 70–99)
Potassium: 4.4 mEq/L (ref 3.5–5.1)
Sodium: 138 mEq/L (ref 135–145)

## 2011-02-15 LAB — HEPATIC FUNCTION PANEL
ALT: 48 U/L (ref 0–53)
AST: 39 U/L — ABNORMAL HIGH (ref 0–37)
Albumin: 4.1 g/dL (ref 3.5–5.2)
Alkaline Phosphatase: 53 U/L (ref 39–117)
Bilirubin, Direct: 0.1 mg/dL (ref 0.0–0.3)
Total Bilirubin: 0.9 mg/dL (ref 0.3–1.2)
Total Protein: 7.5 g/dL (ref 6.0–8.3)

## 2011-02-15 LAB — LIPID PANEL
Cholesterol: 216 mg/dL — ABNORMAL HIGH (ref 0–200)
HDL: 40.4 mg/dL (ref >39.00)
Total CHOL/HDL Ratio: 5
Triglycerides: 189 mg/dL — ABNORMAL HIGH (ref 0.0–149.0)
VLDL: 37.8 mg/dL (ref 0.0–40.0)

## 2011-02-15 LAB — LDL CHOLESTEROL, DIRECT: Direct LDL: 148.2 mg/dL

## 2011-02-16 LAB — TSH: TSH: 1.2 u[IU]/mL (ref 0.35–5.50)

## 2011-02-18 LAB — GC/CHLAMYDIA PROBE AMP, GENITAL
Chlamydia, DNA Probe: NEGATIVE
GC Probe Amp, Genital: POSITIVE — AB

## 2011-02-20 ENCOUNTER — Other Ambulatory Visit: Payer: Self-pay | Admitting: *Deleted

## 2011-02-20 DIAGNOSIS — K219 Gastro-esophageal reflux disease without esophagitis: Secondary | ICD-10-CM

## 2011-02-20 MED ORDER — OMEPRAZOLE 20 MG PO CPDR: 20.0000 mg | DELAYED_RELEASE_CAPSULE | Freq: Every day | ORAL | Status: DC

## 2011-02-21 ENCOUNTER — Encounter: Payer: Self-pay | Admitting: Internal Medicine

## 2011-02-21 ENCOUNTER — Ambulatory Visit (INDEPENDENT_AMBULATORY_CARE_PROVIDER_SITE_OTHER): Payer: PRIVATE HEALTH INSURANCE | Admitting: Internal Medicine

## 2011-02-21 VITALS — BP 122/84 | HR 88 | Temp 97.8°F | Ht 76.0 in | Wt 245.0 lb

## 2011-02-21 DIAGNOSIS — R51 Headache: Secondary | ICD-10-CM

## 2011-02-21 DIAGNOSIS — Z0000 Encounter for general adult medical examination without abnormal findings: Secondary | ICD-10-CM

## 2011-02-21 NOTE — Progress Notes (Signed)
  Subjective:    Patient ID: Trevor Flowers, male    DOB: 1965/11/17, 45 y.o.   MRN: [id number]  HPI  cpx   Past Medical History  Diagnosis Date  . HYPERLIPIDEMIA 01/08/2008  . Headache 12/04/2007  . TRANSAMINASES, SERUM, ELEVATED 01/08/2008   No past surgical history on file.  reports that he quit smoking about 3 years ago. He does not have any smokeless tobacco history on file. His alcohol and drug histories not on file. family history includes Hyperlipidemia in his father and Hypertension in his mother. No Known Allergies   Review of Systems  patient denies chest pain, shortness of breath, orthopnea. Denies lower extremity edema, abdominal pain, change in appetite, change in bowel movements. Patient denies rashes, musculoskeletal complaints. No other specific complaints in a complete review of systems.      Objective:   Physical Exam Well-developed male in no acute distress. HEENT exam atraumatic, normocephalic, extraocular muscles are intact. Conjunctivae are pink without exudate. Neck is supple without lymphadenopathy, thyromegaly, jugular venous distention. Chest is clear to auscultation without increased work of breathing. Cardiac exam S1-S2 are regular. The PMI is normal. No significant murmurs or gallops. Abdominal exam active bowel sounds, soft, nontender. No abdominal bruits. Extremities no clubbing cyanosis or edema. Peripheral pulses are normal without bruits. Neurologic exam alert and oriented without any motor or sensory deficits.       Current outpatient prescriptions:frovatriptan (FROVA) 2.5 MG tablet, Take 2.5 mg by mouth as needed. If recurs, may repeat after 2 hours. Max of 3 tabs in 24 hours. , Disp: , Rfl: ;  HYDROcodone-acetaminophen (VICODIN) 5-500 MG per tablet, Take 1 tablet by mouth as needed.  , Disp: , Rfl: ;  imipramine (TOFRANIL) 10 MG tablet, TAKE 1 TABLET BY MOUTH AT BEDTIME AS NEEDED, Disp: 20 tablet, Rfl: 0 levocetirizine (XYZAL) 5 MG tablet, Take 5  mg by mouth every evening.  , Disp: , Rfl: ;  mometasone (NASONEX) 50 MCG/ACT nasal spray, 2 sprays by Nasal route daily.  , Disp: , Rfl: ;  omeprazole (PRILOSEC) 20 MG capsule, Take 1 capsule (20 mg total) by mouth daily., Disp: 30 capsule, Rfl: 1;  simvastatin (ZOCOR) 40 MG tablet, TAKE 1 TABLET BY MOUTH AT BEDTIME, Disp: 30 tablet, Rfl: 2 DISCONTD: isometheptene-acetaminophen-dichloralphenazone (MIDRIN) 65-325-100 MG per capsule, Take 1 capsule by mouth 4 (four) times daily as needed.  , Disp: , Rfl:       Assessment & Plan:  Well visit Discussed need for diet/exercise and weight loss

## 2011-03-18 ENCOUNTER — Other Ambulatory Visit: Payer: Self-pay | Admitting: Internal Medicine

## 2011-03-20 ENCOUNTER — Other Ambulatory Visit: Payer: Self-pay | Admitting: *Deleted

## 2011-03-20 MED ORDER — IMIPRAMINE HCL 10 MG PO TABS: 10.0000 mg | ORAL_TABLET | Freq: Every day | ORAL | Status: DC

## 2011-03-31 NOTE — Procedures (Signed)
NAMEWILLOW, RECZEK           ACCOUNT NO.:  [id number]   MEDICAL RECORD NO.:  [id number]          PATIENT TYPE:  OUT   LOCATION:  SLEE                          FACILITY:  [facility]   PHYSICIAN:  Kofi A. Gerilyn Pilgrim, M.D. DATE OF BIRTH:  Nov 18, 1965   DATE OF PROCEDURE:  DATE OF DISCHARGE:  12/25/2008                             SLEEP DISORDER REPORT   INDICATIONS:  This is a 45 year old who presents with snoring and has  been evaluated for obstructive sleep apnea syndrome.   MEDICATIONS:  Compazine, Allegra, and Midrin.   EPWORTH SLEEPINESS SCALE:  BMI of 27.   ARCHITECTURAL SUMMARY:  Study was done in a split-night fashion with the  first portion being a diagnostic and the second is titration.  The total  recording time is 394 minutes.  Sleep efficiency is 68%.  Sleep latency  15 minutes.  REM latency 234 minutes.   RESPIRATORY SUMMARY:  The baseline oxygen saturation is 99%.  Lowest  saturation 87%.  The diagnostic AHI study is 6.  The patient was  titrated between pressures of 5 and 8 with an optimal pressure of 7.   LEG MOVEMENT SUMMARY:  PLM index 0.   ELECTROCARDIOGRAM SUMMARY:  Average heart rate 78 with occasional PACs  observed.   IMPRESSION:  Moderate obstructive sleep apnea syndrome, which responded  well to a CPAP of 7.   Thanks for this referral.      Kofi A. Gerilyn Pilgrim, M.D.  Electronically Signed     KAD/MEDQ  D:  12/29/2008  T:  12/29/2008  Job:  [id number]

## 2011-05-03 ENCOUNTER — Other Ambulatory Visit: Payer: Self-pay | Admitting: *Deleted

## 2011-06-30 ENCOUNTER — Telehealth: Payer: Self-pay | Admitting: Internal Medicine

## 2011-07-05 ENCOUNTER — Other Ambulatory Visit: Payer: Self-pay | Admitting: Family Medicine

## 2011-09-06 ENCOUNTER — Other Ambulatory Visit: Payer: Self-pay | Admitting: *Deleted

## 2011-09-06 MED ORDER — IMIPRAMINE HCL 10 MG PO TABS: 10.0000 mg | ORAL_TABLET | Freq: Every day | ORAL | Status: DC

## 2012-04-02 ENCOUNTER — Other Ambulatory Visit: Payer: Self-pay | Admitting: Internal Medicine

## 2012-05-02 ENCOUNTER — Other Ambulatory Visit: Payer: Self-pay | Admitting: *Deleted

## 2012-05-02 DIAGNOSIS — Z0000 Encounter for general adult medical examination without abnormal findings: Secondary | ICD-10-CM

## 2012-05-15 ENCOUNTER — Other Ambulatory Visit (INDEPENDENT_AMBULATORY_CARE_PROVIDER_SITE_OTHER): Payer: PRIVATE HEALTH INSURANCE

## 2012-05-15 DIAGNOSIS — Z0000 Encounter for general adult medical examination without abnormal findings: Secondary | ICD-10-CM

## 2012-05-15 LAB — LDL CHOLESTEROL, DIRECT: Direct LDL: 213.9 mg/dL

## 2012-05-15 LAB — CBC WITH DIFFERENTIAL/PLATELET
Basophils Absolute: 0.1 10*3/uL (ref 0.0–0.1)
Basophils Relative: 1 % (ref 0.0–3.0)
Eosinophils Absolute: 0.2 10*3/uL (ref 0.0–0.7)
Eosinophils Relative: 3.4 % (ref 0.0–5.0)
HCT: 43.3 % (ref 39.0–52.0)
Hemoglobin: 14.5 g/dL (ref 13.0–17.0)
Lymphocytes Relative: 32.8 % (ref 12.0–46.0)
Lymphs Abs: 1.8 10*3/uL (ref 0.7–4.0)
MCHC: 33.6 g/dL (ref 30.0–36.0)
MCV: 91 fl (ref 78.0–100.0)
Monocytes Absolute: 0.4 10*3/uL (ref 0.1–1.0)
Monocytes Relative: 6.7 % (ref 3.0–12.0)
Neutro Abs: 3.1 10*3/uL (ref 1.4–7.7)
Neutrophils Relative %: 56.1 % (ref 43.0–77.0)
Platelets: 286 10*3/uL (ref 150.0–400.0)
RBC: 4.76 Mil/uL (ref 4.22–5.81)
RDW: 13.6 % (ref 11.5–14.6)
WBC: 5.6 10*3/uL (ref 4.5–10.5)

## 2012-05-15 LAB — LIPID PANEL
Cholesterol: 298 mg/dL — ABNORMAL HIGH (ref 0–200)
HDL: 38.1 mg/dL — ABNORMAL LOW (ref >39.00)
Total CHOL/HDL Ratio: 8
Triglycerides: 143 mg/dL (ref 0.0–149.0)
VLDL: 28.6 mg/dL (ref 0.0–40.0)

## 2012-05-15 LAB — POCT URINALYSIS DIPSTICK
Bilirubin, UA: NEGATIVE
Blood, UA: NEGATIVE
Glucose, UA: NEGATIVE
Ketones, UA: NEGATIVE
Leukocytes, UA: NEGATIVE
Nitrite, UA: NEGATIVE
Protein, UA: NEGATIVE
Spec Grav, UA: 1.025
Urobilinogen, UA: 1
pH, UA: 5.5

## 2012-05-15 LAB — HEPATIC FUNCTION PANEL
ALT: 45 U/L (ref 0–53)
AST: 32 U/L (ref 0–37)
Albumin: 4 g/dL (ref 3.5–5.2)
Alkaline Phosphatase: 51 U/L (ref 39–117)
Bilirubin, Direct: 0 mg/dL (ref 0.0–0.3)
Total Bilirubin: 0.7 mg/dL (ref 0.3–1.2)
Total Protein: 8 g/dL (ref 6.0–8.3)

## 2012-05-15 LAB — BASIC METABOLIC PANEL
BUN: 13 mg/dL (ref 6–23)
CO2: 24 mEq/L (ref 19–32)
Calcium: 9.2 mg/dL (ref 8.4–10.5)
Chloride: 104 mEq/L (ref 96–112)
Creatinine, Ser: 1.2 mg/dL (ref 0.4–1.5)
GFR: 87.78 mL/min (ref >60.00)
Glucose, Bld: 94 mg/dL (ref 70–99)
Potassium: 3.7 mEq/L (ref 3.5–5.1)
Sodium: 136 mEq/L (ref 135–145)

## 2012-05-15 LAB — TSH: TSH: 1.12 u[IU]/mL (ref 0.35–5.50)

## 2012-05-31 ENCOUNTER — Encounter: Payer: Self-pay | Admitting: Internal Medicine

## 2012-05-31 ENCOUNTER — Ambulatory Visit (INDEPENDENT_AMBULATORY_CARE_PROVIDER_SITE_OTHER): Payer: PRIVATE HEALTH INSURANCE | Admitting: Internal Medicine

## 2012-05-31 VITALS — BP 126/98 | HR 72 | Temp 97.7°F | Ht 77.0 in | Wt 252.0 lb

## 2012-05-31 DIAGNOSIS — K219 Gastro-esophageal reflux disease without esophagitis: Secondary | ICD-10-CM

## 2012-05-31 DIAGNOSIS — E785 Hyperlipidemia, unspecified: Secondary | ICD-10-CM

## 2012-05-31 DIAGNOSIS — Z0000 Encounter for general adult medical examination without abnormal findings: Secondary | ICD-10-CM

## 2012-05-31 MED ORDER — IMIPRAMINE HCL 10 MG PO TABS: 10.0000 mg | ORAL_TABLET | Freq: Every day | ORAL | Status: DC

## 2012-05-31 MED ORDER — OMEPRAZOLE 20 MG PO CPDR: 20.0000 mg | DELAYED_RELEASE_CAPSULE | Freq: Every day | ORAL | Status: DC

## 2012-05-31 MED ORDER — ATORVASTATIN CALCIUM 10 MG PO TABS: 10.0000 mg | ORAL_TABLET | Freq: Every day | ORAL | Status: DC

## 2012-05-31 NOTE — Assessment & Plan Note (Signed)
Needs treatment Side effects discussed 

## 2012-05-31 NOTE — Progress Notes (Signed)
Patient ID: Trevor Flowers, male   DOB: July 12, 1966, 46 y.o.   MRN: [id number]  CPX  Past Medical History  Diagnosis Date  . HYPERLIPIDEMIA 01/08/2008  . Headache 12/04/2007  . TRANSAMINASES, SERUM, ELEVATED 01/08/2008    History   Social History  . Marital Status: Single    Spouse Name: N/A    Number of Children: N/A  . Years of Education: N/A   Occupational History  . Not on file.   Social History Main Topics  . Smoking status: Former Smoker    Quit date: 08/22/2007  . Smokeless tobacco: Not on file  . Alcohol Use: 6.0 oz/week    10 Cans of beer per week  . Drug Use: Not on file  . Sexually Active: Not on file   Other Topics Concern  . Not on file   Social History Narrative  . No narrative on file    No past surgical history on file.  Family History  Problem Relation Age of Onset  . Hypertension Mother     No Known Allergies  Current Outpatient Prescriptions on File Prior to Visit  Medication Sig Dispense Refill  . imipramine (TOFRANIL) 10 MG tablet Take 1 tablet (10 mg total) by mouth at bedtime.  20 tablet  2  . loratadine (CLARITIN) 10 MG tablet Take 10 mg by mouth daily.      Marland Kitchen omeprazole (PRILOSEC) 20 MG capsule TAKE 1 CAPSULE EVERY DAY  30 capsule  3  . SUMAtriptan (IMITREX) 100 MG tablet Take 100 mg by mouth every 2 (two) hours as needed.         patient denies chest pain, shortness of breath, orthopnea. Denies lower extremity edema, abdominal pain, change in appetite, change in bowel movements. Patient denies rashes, musculoskeletal complaints. No other specific complaints in a complete review of systems.   BP 126/98  Pulse 72  Temp 97.7 F (36.5 C) (Oral)  Ht 6\' 5"  (1.956 m)  Wt 252 lb (114.306 kg)  BMI 29.88 kg/m2 Well-developed male in no acute distress. HEENT exam atraumatic, normocephalic, extraocular muscles are intact. Conjunctivae are pink without exudate. Neck is supple without lymphadenopathy, thyromegaly, jugular venous distention.  Chest is clear to auscultation without increased work of breathing. Cardiac exam S1-S2 are regular. The PMI is normal. No significant murmurs or gallops. Abdominal exam active bowel sounds, soft, nontender. No abdominal bruits. Extremities no clubbing cyanosis or edema. Peripheral pulses are normal without bruits. Neurologic exam alert and oriented without any motor or sensory deficits.   A/P- Well Visit: health Maint UTD

## 2012-07-09 ENCOUNTER — Other Ambulatory Visit: Payer: Self-pay | Admitting: Internal Medicine

## 2012-08-08 ENCOUNTER — Other Ambulatory Visit: Payer: Self-pay | Admitting: Internal Medicine

## 2012-08-30 ENCOUNTER — Other Ambulatory Visit: Payer: PRIVATE HEALTH INSURANCE

## 2012-09-23 ENCOUNTER — Ambulatory Visit (INDEPENDENT_AMBULATORY_CARE_PROVIDER_SITE_OTHER): Payer: PRIVATE HEALTH INSURANCE | Admitting: Family Medicine

## 2012-09-23 ENCOUNTER — Telehealth: Payer: Self-pay | Admitting: Internal Medicine

## 2012-09-23 ENCOUNTER — Encounter: Payer: Self-pay | Admitting: Family Medicine

## 2012-09-23 VITALS — BP 134/90 | Temp 97.9°F | Wt 247.0 lb

## 2012-09-23 DIAGNOSIS — J019 Acute sinusitis, unspecified: Secondary | ICD-10-CM

## 2012-09-23 MED ORDER — AZITHROMYCIN 250 MG PO TABS: ORAL_TABLET | ORAL | Status: DC

## 2012-09-23 NOTE — Patient Instructions (Signed)
Please follow up in one week if your symptoms have not improved.

## 2012-09-23 NOTE — Telephone Encounter (Signed)
Trevor Flowers states he has had pain at cheekbones x one week. Glands on right side of neck  swollen. Headache with no relief by over the counter meds. No relief with Allegra. Per Upper Respiratory Infection has call provider immediately disposition due to moderate to severe headache unrelieved by non prescription medication. Requesting antibioitics be called in. Explained no antibiotics called in without being seen in office. Appt scheduled in [facility] for 16:00 on 09/23/12. Care advice given.

## 2012-09-23 NOTE — Progress Notes (Signed)
Subjective:     Patient ID: Trevor Flowers, male   DOB: [birth year], 46 y.o.   MRN: [id number]  HPI 46 year old pt of Dr. Cato Mulligan here for evaluation of intermittent maxillary sinus pain and congestion for the past month.  States that pain has been accompanied by sneezing, green or yellow nasal discharge, sore throat in the morning, some mild hoarseness and tinnitus.  Pain has worsened over the past week.  Pt feels tired and run down.  States that he has had similar episodes in the past relieved with a Z-pak.  Has tried several OTC remedies including Benadryl and decongestants, which work only temporarily.  Sinus washes with saltwater also have only provided temporary relief.  Review of Systems  Constitutional: Negative for fever and chills.       Feels more tired than usual.  HENT: Positive for congestion, sore throat (in the morning when he wakes up.), rhinorrhea, sneezing, postnasal drip, sinus pressure and tinnitus. Negative for hearing loss and ear pain.        Maxillary sinus pain.  Eyes: Negative for discharge and itching.  Respiratory: Negative for chest tightness and shortness of breath.   Cardiovascular: Negative for chest pain.       Objective:   Physical Exam  Constitutional: He appears well-developed and well-nourished. No distress.  HENT:  Head: Normocephalic and atraumatic.  Right Ear: External ear normal.  Left Ear: External ear normal.  Mouth/Throat: Oropharynx is clear and moist. No oropharyngeal exudate.       Some swelling and erythema of nasal mucosa. Tenderness to palpation over maxillary sinuses bilaterally.   Eyes: Right eye exhibits no discharge. Left eye exhibits no discharge.  Cardiovascular: Normal rate, regular rhythm and normal heart sounds.   Pulmonary/Chest: Effort normal and breath sounds normal. No respiratory distress. He has no wheezes. He has no rales.  Lymphadenopathy:    He has no cervical adenopathy.       Assessment:     46 year old  male with 32-month history of intermittent maxillary sinus pain and congestion.     Plan:     1. Sinus pain and congestion: likely sinusitis.  Given length of time that patient has remained symptomatic, and that OTC treatments including Benadryl, decongestants, and saline washouts have provided only temporary relief without gradual reduction in symptoms over time, it would be appropriate to consider antibiotic treatment for possible bacterial sinusitis.  Rx Z-pak 250mg  2 tabs today, 1 tab each day for the next 4 days.  Follow up in 1 week if symptoms still not improved.  Marthann Schiller, MS3  Agree with assessment and plan as per Marthann Schiller, MS 3 Evelena Peat MD

## 2012-09-28 ENCOUNTER — Other Ambulatory Visit: Payer: Self-pay | Admitting: Internal Medicine

## 2012-11-02 ENCOUNTER — Other Ambulatory Visit: Payer: Self-pay | Admitting: Internal Medicine

## 2013-01-08 ENCOUNTER — Telehealth: Payer: Self-pay | Admitting: Internal Medicine

## 2013-01-08 NOTE — Telephone Encounter (Signed)
Pt missed blood work back in Oct of 2013. Pt would like to come in for that blood work now. Concerned about blood pressure and cholesterol.

## 2013-01-09 NOTE — Telephone Encounter (Signed)
Done, appt set/kjh

## 2013-01-09 NOTE — Telephone Encounter (Signed)
Schedule ov with any provider to review bp

## 2013-01-09 NOTE — Telephone Encounter (Signed)
Dr swords note said for him to have an appointment with someone and they will check his bp and decided then if he needs labs

## 2013-01-10 ENCOUNTER — Other Ambulatory Visit: Payer: Self-pay

## 2013-01-10 ENCOUNTER — Ambulatory Visit (INDEPENDENT_AMBULATORY_CARE_PROVIDER_SITE_OTHER): Payer: BC Managed Care – PPO | Admitting: Family

## 2013-01-10 ENCOUNTER — Encounter: Payer: Self-pay | Admitting: Family

## 2013-01-10 VITALS — BP 170/110 | Temp 97.5°F | Wt 240.0 lb

## 2013-01-10 DIAGNOSIS — I10 Essential (primary) hypertension: Secondary | ICD-10-CM

## 2013-01-10 DIAGNOSIS — E785 Hyperlipidemia, unspecified: Secondary | ICD-10-CM

## 2013-01-10 DIAGNOSIS — R040 Epistaxis: Secondary | ICD-10-CM

## 2013-01-10 LAB — LIPID PANEL
Cholesterol: 198 mg/dL (ref 0–200)
HDL: 41.5 mg/dL (ref >39.00)
LDL Cholesterol: 127 mg/dL — ABNORMAL HIGH (ref 0–99)
Total CHOL/HDL Ratio: 5
Triglycerides: 150 mg/dL — ABNORMAL HIGH (ref 0.0–149.0)
VLDL: 30 mg/dL (ref 0.0–40.0)

## 2013-01-10 LAB — HEPATIC FUNCTION PANEL
ALT: 54 U/L — ABNORMAL HIGH (ref 0–53)
AST: 34 U/L (ref 0–37)
Albumin: 4.1 g/dL (ref 3.5–5.2)
Alkaline Phosphatase: 59 U/L (ref 39–117)
Bilirubin, Direct: 0 mg/dL (ref 0.0–0.3)
Total Bilirubin: 0.7 mg/dL (ref 0.3–1.2)
Total Protein: 7.9 g/dL (ref 6.0–8.3)

## 2013-01-10 LAB — BASIC METABOLIC PANEL
BUN: 11 mg/dL (ref 6–23)
CO2: 25 mEq/L (ref 19–32)
Calcium: 9.6 mg/dL (ref 8.4–10.5)
Chloride: 104 mEq/L (ref 96–112)
Creatinine, Ser: 1.1 mg/dL (ref 0.4–1.5)
GFR: 89.29 mL/min (ref >60.00)
Glucose, Bld: 86 mg/dL (ref 70–99)
Potassium: 4 mEq/L (ref 3.5–5.1)
Sodium: 138 mEq/L (ref 135–145)

## 2013-01-10 MED ORDER — LISINOPRIL-HYDROCHLOROTHIAZIDE 20-25 MG PO TABS: 1.0000 | ORAL_TABLET | Freq: Every day | ORAL | Status: DC

## 2013-01-10 NOTE — Progress Notes (Signed)
Subjective:    Patient ID: Trevor Flowers, male    DOB: 1966/01/22, 47 y.o.   MRN: [id number]  HPI 47 year old male, patient of Dr. Cato Mulligan is in today with elevated blood pressure and a history of hyperlipidemia. He's not currently on any medications for blood pressure. He's currently taking Lipitor 10 mg once daily for cholesterol. Reports having a diet high in sodium. He does not routinely exercise. He recently stopped smoking in 2008.   Review of Systems  Constitutional: Negative.   HENT: Positive for nosebleeds. Negative for congestion, sore throat, rhinorrhea, sneezing, postnasal drip and sinus pressure.   Eyes: Negative.   Respiratory: Negative.   Cardiovascular: Negative.   Gastrointestinal: Negative.   Endocrine: Negative.   Genitourinary: Negative.   Musculoskeletal: Negative.   Skin: Negative.   Neurological: Negative.  Negative for headaches.  Hematological: Negative.   Psychiatric/Behavioral: Negative.    Past Medical History  Diagnosis Date  . HYPERLIPIDEMIA 01/08/2008  . Headache 12/04/2007  . TRANSAMINASES, SERUM, ELEVATED 01/08/2008    History   Social History  . Marital Status: Single    Spouse Name: N/A    Number of Children: N/A  . Years of Education: N/A   Occupational History  . Not on file.   Social History Main Topics  . Smoking status: Former Smoker    Quit date: 08/22/2007  . Smokeless tobacco: Not on file  . Alcohol Use: 6.0 oz/week    10 Cans of beer per week  . Drug Use: Not on file  . Sexually Active: Not on file   Other Topics Concern  . Not on file   Social History Narrative  . No narrative on file    No past surgical history on file.  Family History  Problem Relation Age of Onset  . Hypertension Mother     No Known Allergies  Current Outpatient Prescriptions on File Prior to Visit  Medication Sig Dispense Refill  . atorvastatin (LIPITOR) 10 MG tablet Take 1 tablet (10 mg total) by mouth daily.  90 tablet  3  .  imipramine (TOFRANIL) 10 MG tablet TAKE 1 TABLET BY MOUTH AT BEDTIME AS NEEDED  20 tablet  2  . loratadine (CLARITIN) 10 MG tablet Take 10 mg by mouth daily.      Marland Kitchen omeprazole (PRILOSEC) 20 MG capsule TAKE 1 CAPSULE EVERY DAY  30 capsule  0  . SUMAtriptan (IMITREX) 100 MG tablet Take 100 mg by mouth every 2 (two) hours as needed.       No current facility-administered medications on file prior to visit.    BP 170/110  Temp(Src) 97.5 F (36.4 C) (Oral)  Wt 240 lb (108.863 kg)  BMI 28.45 kg/m2chart    Objective:   Physical Exam  Constitutional: He is oriented to person, place, and time. He appears well-developed and well-nourished.  HENT:  Right Ear: External ear normal.  Left Ear: External ear normal.  Nose: Nose normal.  Mouth/Throat: Oropharynx is clear and moist.  Neck: Normal range of motion. Neck supple. No thyromegaly present.  Cardiovascular: Normal rate, regular rhythm and normal heart sounds.   Recheck blood pressure 160/110  Pulmonary/Chest: Effort normal and breath sounds normal.  Abdominal: Soft. Bowel sounds are normal.  Musculoskeletal: Normal range of motion.  Neurological: He is alert and oriented to person, place, and time. He has normal reflexes.  Skin: Skin is warm and dry.  Psychiatric: He has a normal mood and affect.  Assessment & Plan:  Assessment:  1. Hypertension-uncontrolled 2. Hyperlipidemia 3. Epistaxis  Plan: Lab sent to include BMP, LFTs, lipids will notify patient pending results. Coolmist humidifier for nosebleeds. Also advise nasal saline when necessary. Will start lisinopril HCT 20/25 once daily. Encouraged a low sodium diet. We'll followup with patient in 2 weeks, and sooner as needed.

## 2013-01-10 NOTE — Patient Instructions (Addendum)
Hypertension As your heart beats, it forces blood through your arteries. This force is your blood pressure. If the pressure is too high, it is called hypertension (HTN) or high blood pressure. HTN is dangerous because you may have it and not know it. High blood pressure may mean that your heart has to work harder to pump blood. Your arteries may be narrow or stiff. The extra work puts you at risk for heart disease, stroke, and other problems.  Blood pressure consists of two numbers, a higher number over a lower, 110/72, for example. It is stated as "110 over 72." The ideal is below 120 for the top number (systolic) and under 80 for the bottom (diastolic). Write down your blood pressure today. You should pay close attention to your blood pressure if you have certain conditions such as:  Heart failure.  Prior heart attack.  Diabetes  Chronic kidney disease.  Prior stroke.  Multiple risk factors for heart disease. To see if you have HTN, your blood pressure should be measured while you are seated with your arm held at the level of the heart. It should be measured at least twice. A one-time elevated blood pressure reading (especially in the Emergency Department) does not mean that you need treatment. There may be conditions in which the blood pressure is different between your right and left arms. It is important to see your caregiver soon for a recheck. Most people have essential hypertension which means that there is not a specific cause. This type of high blood pressure may be lowered by changing lifestyle factors such as:  Stress.  Smoking.  Lack of exercise.  Excessive weight.  Drug/tobacco/alcohol use.  Eating less salt. Most people do not have symptoms from high blood pressure until it has caused damage to the body. Effective treatment can often prevent, delay or reduce that damage. TREATMENT  When a cause has been identified, treatment for high blood pressure is directed at the  cause. There are a large number of medications to treat HTN. These fall into several categories, and your caregiver will help you select the medicines that are best for you. Medications may have side effects. You should review side effects with your caregiver. If your blood pressure stays high after you have made lifestyle changes or started on medicines,   Your medication(s) may need to be changed.  Other problems may need to be addressed.  Be certain you understand your prescriptions, and know how and when to take your medicine.  Be sure to follow up with your caregiver within the time frame advised (usually within two weeks) to have your blood pressure rechecked and to review your medications.  If you are taking more than one medicine to lower your blood pressure, make sure you know how and at what times they should be taken. Taking two medicines at the same time can result in blood pressure that is too low. SEEK IMMEDIATE MEDICAL CARE IF:  You develop a severe headache, blurred or changing vision, or confusion.  You have unusual weakness or numbness, or a faint feeling.  You have severe chest or abdominal pain, vomiting, or breathing problems. MAKE SURE YOU:   Understand these instructions.  Will watch your condition.  Will get help right away if you are not doing well or get worse. Document Released: 10/30/2005 Document Revised: 01/22/2012 Document Reviewed: 06/19/2008 ExitCare Patient Information 2013 [employer]   1.5 Gram Low Sodium Diet A 1.5 gram sodium diet restricts the amount of   sodium in the diet to no more than 1.5 g or 1500 mg daily. The American Heart Association recommends Americans over the age of 20 to consume no more than 1500 mg of sodium each day to reduce the risk of developing high blood pressure. Research also shows that limiting sodium may reduce heart attack and stroke risk. Many foods contain sodium for flavor and sometimes as a preservative. When the  amount of sodium in a diet needs to be low, it is important to know what to look for when choosing foods and drinks. The following includes some information and guidelines to help make it easier for you to adapt to a low sodium diet. QUICK TIPS  Do not add salt to food.  Avoid convenience items and fast food.  Choose unsalted snack foods.  Buy lower sodium products, often labeled as "lower sodium" or "no salt added."  Check food labels to learn how much sodium is in 1 serving.  When eating at a restaurant, ask that your food be prepared with less salt or none, if possible. READING FOOD LABELS FOR SODIUM INFORMATION The nutrition facts label is a good place to find how much sodium is in foods. Look for products with no more than 400 mg of sodium per serving. Remember that 1.5 g = 1500 mg. The food label may also list foods as:  Sodium-free: Less than 5 mg in a serving.  Very low sodium: 35 mg or less in a serving.  Low-sodium: 140 mg or less in a serving.  Light in sodium: 50% less sodium in a serving. For example, if a food that usually has 300 mg of sodium is changed to become light in sodium, it will have 150 mg of sodium.  Reduced sodium: 25% less sodium in a serving. For example, if a food that usually has 400 mg of sodium is changed to reduced sodium, it will have 300 mg of sodium. CHOOSING FOODS Grains  Avoid: Salted crackers and snack items. Some cereals, including instant hot cereals. Bread stuffing and biscuit mixes. Seasoned rice or pasta mixes.  Choose: Unsalted snack items. Low-sodium cereals, oats, puffed wheat and rice, shredded wheat. English muffins and bread. Pasta. Meats  Avoid: Salted, canned, smoked, spiced, pickled meats, including fish and poultry. Bacon, ham, sausage, cold cuts, hot dogs, anchovies.  Choose: Low-sodium canned tuna and salmon. Fresh or frozen meat, poultry, and fish. Dairy  Avoid: Processed cheese and spreads. Cottage cheese. Buttermilk  and condensed milk. Regular cheese.  Choose: Milk. Low-sodium cottage cheese. Yogurt. Sour cream. Low-sodium cheese. Fruits and Vegetables  Avoid: Regular canned vegetables. Regular canned tomato sauce and paste. Frozen vegetables in sauces. Olives. Pickles. Relishes. Sauerkraut.  Choose: Low-sodium canned vegetables. Low-sodium tomato sauce and paste. Frozen or fresh vegetables. Fresh and frozen fruit. Condiments  Avoid: Canned and packaged gravies. Worcestershire sauce. Tartar sauce. Barbecue sauce. Soy sauce. Steak sauce. Ketchup. Onion, garlic, and table salt. Meat flavorings and tenderizers.  Choose: Fresh and dried herbs and spices. Low-sodium varieties of mustard and ketchup. Lemon juice. Tabasco sauce. Horseradish. SAMPLE 1.5 GRAM SODIUM MEAL PLAN Breakfast / Sodium (mg)  1 cup low-fat milk / 143 mg  1 whole-wheat English muffin / 240 mg  1 tbs heart-healthy margarine / 153 mg  1 hard-boiled egg / 139 mg  1 small orange / 0 mg Lunch / Sodium (mg)  1 cup raw carrots / 76 mg  2 tbs no salt added peanut butter / 5 mg  2 slices whole-wheat   bread / 270 mg  1 tbs jelly / 6 mg   cup red grapes / 2 mg Dinner / Sodium (mg)  1 cup whole-wheat pasta / 2 mg  1 cup low-sodium tomato sauce / 73 mg  3 oz lean ground beef / 57 mg  1 small side salad (1 cup raw spinach leaves,  cup cucumber,  cup yellow bell pepper) with 1 tsp olive oil and 1 tsp red wine vinegar / 25 mg Snack / Sodium (mg)  1 container low-fat vanilla yogurt / 107 mg  3 graham cracker squares / 127 mg Nutrient Analysis  Calories: 1745  Protein: 75 g  Carbohydrate: 237 g  Fat: 57 g  Sodium: 1425 mg Document Released: 10/30/2005 Document Revised: 01/22/2012 Document Reviewed: 01/31/2010 ExitCare Patient Information 2013 [employer].  

## 2013-01-15 ENCOUNTER — Other Ambulatory Visit: Payer: Self-pay | Admitting: Internal Medicine

## 2013-01-17 ENCOUNTER — Ambulatory Visit: Payer: PRIVATE HEALTH INSURANCE | Admitting: Family

## 2013-01-24 ENCOUNTER — Encounter: Payer: Self-pay | Admitting: Family

## 2013-01-24 ENCOUNTER — Ambulatory Visit (INDEPENDENT_AMBULATORY_CARE_PROVIDER_SITE_OTHER): Payer: BC Managed Care – PPO | Admitting: Family

## 2013-01-24 ENCOUNTER — Ambulatory Visit (INDEPENDENT_AMBULATORY_CARE_PROVIDER_SITE_OTHER)
Admission: RE | Admit: 2013-01-24 | Discharge: 2013-01-24 | Disposition: A | Discharge status: Home / Self Care | Payer: BC Managed Care – PPO | Source: Ambulatory Visit | Attending: Family | Admitting: Family

## 2013-01-24 VITALS — BP 132/98 | HR 97 | Wt 240.0 lb

## 2013-01-24 DIAGNOSIS — M79671 Pain in right foot: Secondary | ICD-10-CM

## 2013-01-24 DIAGNOSIS — M79609 Pain in unspecified limb: Secondary | ICD-10-CM

## 2013-01-24 DIAGNOSIS — M79672 Pain in left foot: Secondary | ICD-10-CM

## 2013-01-24 DIAGNOSIS — I10 Essential (primary) hypertension: Secondary | ICD-10-CM

## 2013-01-24 MED ORDER — MELOXICAM 15 MG PO TABS: 15.0000 mg | ORAL_TABLET | Freq: Every day | ORAL | Status: DC

## 2013-01-24 NOTE — Patient Instructions (Signed)
Heel Spur  A heel spur is a hook of bone that can form on the calcaneus (the heel bone and the largest bone of the foot). Heel spurs are often associated with plantar fasciitis and usually come in people who have had the problem for an extended period of time. The cause of the relationship is unknown. The pain associated with them is thought to be caused by an inflammation (soreness and redness) of the plantar fascia rather than the spur itself. The plantar fascia is a thick fibrous like tissue that runs from the calcaneus (heel bone) to the ball of the foot. This strong, tight tissue helps maintain the arch of your foot. It helps distribute the weight across your foot as you walk or run. Stresses placed on the plantar fascia can be tremendous. When it is inflamed normal activities become painful. Pain is worse in the morning after sleeping. After sleeping the plantar fascia is tight. The first movements stretch the fascia and this causes pain. As the tendon loosens, the pain usually gets better. It often returns with too much standing or walking.   About 70% of patients with plantar fasciitis have a heel spur. About half of people without foot pain also have heel spurs.  DIAGNOSIS   The diagnosis of a heel spur is made by X-ray. The X-ray shows a hook of bone protruding from the bottom of the calcaneus at the point where the plantar fascia is attached to the heel bone.   TREATMENT   It is necessary to find out what is causing the stretching of the plantar fascia. If the cause is over-pronation (flat feet), orthotics and proper foot ware may help.   Stretching exercises, losing weight, wearing shoes that have a cushioned heel that absorbs shock, and elevating the heel with the use of a heel cradle, heel cup, or orthotics may all help. Heel cradles and heel cups provide extra comfort and cushion to the heel, and reduce the amount of shock to the sore area.  AVOIDING THE PAIN OF PLANTAR FASCIITIS AND HEEL  SPURS   Consult a sports medicine professional before beginning a new exercise program.   Walking programs offer a good workout. There is a lower chance of overuse injuries common to the runners. There is less impact and less jarring of the joints.   Begin all new exercise programs slowly. If problems or pains develop, decrease the amount of time or distance until you are at a comfortable level.   Wear good shoes and replace them regularly.   Stretch your foot and the heel cords at the back of the ankle (Achilles tendons) both before and after exercise.   Run or exercise on even surfaces that are not hard. For example, asphalt is better than pavement.   Do not run barefoot on hard surfaces.   If using a treadmill, vary the incline.   Do not continue to workout if you have foot or joint problems. Seek professional help if they do not improve.  HOME CARE INSTRUCTIONS    Avoid activities that cause you pain until you recover.   Use ice or cold packs to the problem or painful areas after working out.   Only take over-the-counter or prescription medicines for pain, discomfort, or fever as directed by your caregiver.   Soft shoe inserts or athletic shoes with air or gel sole cushions may be helpful.   If problems continue or become more severe, consult a sports medicine caregiver. Cortisone is a   potent anti-inflammatory medication that may be injected into the painful area. You can discuss this treatment with your caregiver.  MAKE SURE YOU:    Understand these instructions.   Will watch your condition.   Will get help right away if you are not doing well or get worse.  Document Released: 12/06/2005 Document Revised: 01/22/2012 Document Reviewed: 02/07/2006  ExitCare Patient Information 2013 [employer].

## 2013-01-24 NOTE — Progress Notes (Signed)
Subjective:    Patient ID: Trevor Flowers, male    DOB: Apr 29, 1966, 47 y.o.   MRN: [id number]  HPI 47 year old American male, nonsmoker is in for recheck of hypertension. At his last office visit we start lisinopril HCT 20/25. She's tolerating the medication well. Denies any concerns. Reports forgetting to take his medication today, but has not missed any doses prior to.  Patient has concerns of bilateral feet pain x1-2 years but worsening. Has not taken any medication over-the-counter. Wears boots every day to full work. Reports heavy lifting. Has tried various insoles with no relief. Pain is a 6/10.   Review of Systems  Constitutional: Negative.   HENT: Negative.   Respiratory: Negative.   Cardiovascular: Negative.   Gastrointestinal: Negative.   Genitourinary: Negative.   Musculoskeletal:       Heel pain bilateral  Skin: Negative.   Allergic/Immunologic: Negative.   Neurological: Negative.   Hematological: Negative.   Psychiatric/Behavioral: Negative.    Past Medical History  Diagnosis Date  . HYPERLIPIDEMIA 01/08/2008  . Headache 12/04/2007  . TRANSAMINASES, SERUM, ELEVATED 01/08/2008    History   Social History  . Marital Status: Single    Spouse Name: N/A    Number of Children: N/A  . Years of Education: N/A   Occupational History  . Not on file.   Social History Main Topics  . Smoking status: Former Smoker    Quit date: 08/22/2007  . Smokeless tobacco: Not on file  . Alcohol Use: 6.0 oz/week    10 Cans of beer per week  . Drug Use: Not on file  . Sexually Active: Not on file   Other Topics Concern  . Not on file   Social History Narrative  . No narrative on file    History reviewed. No pertinent past surgical history.  Family History  Problem Relation Age of Onset  . Hypertension Mother     No Known Allergies  Current Outpatient Prescriptions on File Prior to Visit  Medication Sig Dispense Refill  . atorvastatin (LIPITOR) 10 MG tablet  Take 1 tablet (10 mg total) by mouth daily.  90 tablet  3  . imipramine (TOFRANIL) 10 MG tablet TAKE 1 TABLET BY MOUTH AT BEDTIME AS NEEDED  20 tablet  2  . lisinopril-hydrochlorothiazide (PRINZIDE,ZESTORETIC) 20-25 MG per tablet Take 1 tablet by mouth daily.  30 tablet  3  . loratadine (CLARITIN) 10 MG tablet Take 10 mg by mouth daily.      Marland Kitchen omeprazole (PRILOSEC) 20 MG capsule TAKE 1 CAPSULE EVERY DAY  30 capsule  0  . SUMAtriptan (IMITREX) 100 MG tablet Take 100 mg by mouth every 2 (two) hours as needed.       No current facility-administered medications on file prior to visit.    BP 132/98  Pulse 97  Wt 240 lb (108.863 kg)  BMI 28.45 kg/m2  SpO2 98%chart    Objective:   Physical Exam  Constitutional: He is oriented to person, place, and time. He appears well-developed and well-nourished.  HENT:  Right Ear: External ear normal.  Left Ear: External ear normal.  Nose: Nose normal.  Mouth/Throat: Oropharynx is clear and moist.  Neck: Normal range of motion. Neck supple.  Cardiovascular: Normal rate, regular rhythm and normal heart sounds.   Pulmonary/Chest: Effort normal and breath sounds normal.  Abdominal: Bowel sounds are normal.  Musculoskeletal: He exhibits tenderness. He exhibits no edema.  Tenderness to palpation of the heels bilaterally.-Mild. No swelling. Pedal pulses 2  out of 2. Full range of motion with no pain.  Neurological: He is alert and oriented to person, place, and time.  Skin: Skin is warm and dry.  Psychiatric: He has a normal mood and affect.          Assessment & Plan:  Assessment:  1. Hypertension 2. Bilateral heel pain  Plan: Continue lisinopril HCT. X-ray of both feet to rule out heel spurs. Mobic as needed for pain. Patient call the office if symptoms worsen or persist. Recheck a schedule, and as needed.

## 2013-03-24 ENCOUNTER — Other Ambulatory Visit: Payer: Self-pay | Admitting: *Deleted

## 2013-03-24 MED ORDER — OMEPRAZOLE 20 MG PO CPDR: DELAYED_RELEASE_CAPSULE | ORAL | Status: DC

## 2013-04-28 ENCOUNTER — Ambulatory Visit (INDEPENDENT_AMBULATORY_CARE_PROVIDER_SITE_OTHER): Payer: BC Managed Care – PPO | Admitting: Internal Medicine

## 2013-04-28 ENCOUNTER — Encounter: Payer: Self-pay | Admitting: Internal Medicine

## 2013-04-28 ENCOUNTER — Ambulatory Visit: Payer: BC Managed Care – PPO | Admitting: Internal Medicine

## 2013-04-28 VITALS — BP 128/90 | HR 80 | Temp 97.9°F | Wt 230.0 lb

## 2013-04-28 DIAGNOSIS — I10 Essential (primary) hypertension: Secondary | ICD-10-CM

## 2013-04-28 DIAGNOSIS — E785 Hyperlipidemia, unspecified: Secondary | ICD-10-CM

## 2013-04-28 DIAGNOSIS — R7401 Elevation of levels of liver transaminase levels: Secondary | ICD-10-CM

## 2013-04-28 DIAGNOSIS — R7402 Elevation of levels of lactic acid dehydrogenase (LDH): Secondary | ICD-10-CM

## 2013-04-28 LAB — BASIC METABOLIC PANEL
BUN: 20 mg/dL (ref 6–23)
CO2: 28 mEq/L (ref 19–32)
Calcium: 9.1 mg/dL (ref 8.4–10.5)
Chloride: 103 mEq/L (ref 96–112)
Creatinine, Ser: 1 mg/dL (ref 0.4–1.5)
GFR: 101.38 mL/min (ref >60.00)
Glucose, Bld: 89 mg/dL (ref 70–99)
Potassium: 3.5 mEq/L (ref 3.5–5.1)
Sodium: 136 mEq/L (ref 135–145)

## 2013-04-28 LAB — HEPATIC FUNCTION PANEL
ALT: 36 U/L (ref 0–53)
AST: 27 U/L (ref 0–37)
Albumin: 4 g/dL (ref 3.5–5.2)
Alkaline Phosphatase: 43 U/L (ref 39–117)
Bilirubin, Direct: 0.1 mg/dL (ref 0.0–0.3)
Total Bilirubin: 0.8 mg/dL (ref 0.3–1.2)
Total Protein: 7.8 g/dL (ref 6.0–8.3)

## 2013-04-28 LAB — LIPID PANEL
Cholesterol: 213 mg/dL — ABNORMAL HIGH (ref 0–200)
HDL: 44.7 mg/dL (ref >39.00)
Total CHOL/HDL Ratio: 5
Triglycerides: 220 mg/dL — ABNORMAL HIGH (ref 0.0–149.0)
VLDL: 44 mg/dL — ABNORMAL HIGH (ref 0.0–40.0)

## 2013-04-28 LAB — LDL CHOLESTEROL, DIRECT: Direct LDL: 141.8 mg/dL

## 2013-04-28 MED ORDER — OMEPRAZOLE 20 MG PO CPDR: DELAYED_RELEASE_CAPSULE | ORAL | Status: DC

## 2013-04-28 NOTE — Progress Notes (Signed)
Patient comes in for followup. Patient has known hyperlipidemia, hypertension, gastroesophageal reflux disease. He is tolerating medications without difficulty. Patient also has a history of headaches. These are fairly well controlled with imipramine.  Reviewed past medical history, medications.  Reviewed family history.  Reviewed social history.  Review of systems: Patient denies chest pain, shortness of breath, PND, orthopnea. Patient denies headache currently. No other specific complaints in the review of systems.  Physical exam:  well-developed well-nourished male in no acute distress. HEENT exam atraumatic, normocephalic, neck supple without jugular venous distention. Chest clear to auscultation cardiac exam S1-S2 are regular. Abdominal exam overweight with bowel sounds, soft and nontender. Extremities no edema. Neurologic exam is alert with a normal gait.

## 2013-04-29 NOTE — Assessment & Plan Note (Signed)
Continue same meds Adequate control

## 2013-05-22 ENCOUNTER — Telehealth: Payer: Self-pay | Admitting: Internal Medicine

## 2013-05-22 MED ORDER — LISINOPRIL-HYDROCHLOROTHIAZIDE 20-25 MG PO TABS: 1.0000 | ORAL_TABLET | Freq: Every day | ORAL | Status: DC

## 2013-05-22 NOTE — Telephone Encounter (Signed)
Pt called to request a 3 month refill of lisinopril-hydrochlorothiazide (PRINZIDE,ZESTORETIC) 20-25 MG per tablet. He would like this sent to [facility] in [place]. Please assist.

## 2013-05-22 NOTE — Telephone Encounter (Signed)
This was done a dn sent to [facility] in [place]

## 2013-06-21 ENCOUNTER — Other Ambulatory Visit: Payer: Self-pay | Admitting: Internal Medicine

## 2013-07-04 ENCOUNTER — Other Ambulatory Visit: Payer: Self-pay | Admitting: Family

## 2013-12-15 ENCOUNTER — Other Ambulatory Visit: Payer: Self-pay | Admitting: Internal Medicine

## 2014-02-19 ENCOUNTER — Other Ambulatory Visit: Payer: Self-pay | Admitting: Internal Medicine

## 2014-03-23 ENCOUNTER — Other Ambulatory Visit (INDEPENDENT_AMBULATORY_CARE_PROVIDER_SITE_OTHER): Payer: BC Managed Care – PPO

## 2014-03-23 DIAGNOSIS — Z0000 Encounter for general adult medical examination without abnormal findings: Secondary | ICD-10-CM

## 2014-03-23 LAB — POCT URINALYSIS DIPSTICK
Bilirubin, UA: NEGATIVE
Blood, UA: NEGATIVE
Glucose, UA: NEGATIVE
Ketones, UA: NEGATIVE
Leukocytes, UA: NEGATIVE
Nitrite, UA: NEGATIVE
Protein, UA: NEGATIVE
Spec Grav, UA: 1.02
Urobilinogen, UA: 0.2
pH, UA: 5.5

## 2014-03-23 LAB — BASIC METABOLIC PANEL
BUN: 13 mg/dL (ref 6–23)
CO2: 26 mEq/L (ref 19–32)
Calcium: 9.4 mg/dL (ref 8.4–10.5)
Chloride: 102 mEq/L (ref 96–112)
Creatinine, Ser: 1.1 mg/dL (ref 0.4–1.5)
GFR: 97.66 mL/min (ref >60.00)
Glucose, Bld: 100 mg/dL — ABNORMAL HIGH (ref 70–99)
Potassium: 4.1 mEq/L (ref 3.5–5.1)
Sodium: 135 mEq/L (ref 135–145)

## 2014-03-23 LAB — CBC WITH DIFFERENTIAL/PLATELET
Basophils Absolute: 0 10*3/uL (ref 0.0–0.1)
Basophils Relative: 0.7 % (ref 0.0–3.0)
Eosinophils Absolute: 0.2 10*3/uL (ref 0.0–0.7)
Eosinophils Relative: 3.3 % (ref 0.0–5.0)
HCT: 44.3 % (ref 39.0–52.0)
Hemoglobin: 15.1 g/dL (ref 13.0–17.0)
Lymphocytes Relative: 36.4 % (ref 12.0–46.0)
Lymphs Abs: 2.5 10*3/uL (ref 0.7–4.0)
MCHC: 34 g/dL (ref 30.0–36.0)
MCV: 89.4 fl (ref 78.0–100.0)
Monocytes Absolute: 0.5 10*3/uL (ref 0.1–1.0)
Monocytes Relative: 7.8 % (ref 3.0–12.0)
Neutro Abs: 3.6 10*3/uL (ref 1.4–7.7)
Neutrophils Relative %: 51.8 % (ref 43.0–77.0)
Platelets: 301 10*3/uL (ref 150.0–400.0)
RBC: 4.96 Mil/uL (ref 4.22–5.81)
RDW: 13.5 % (ref 11.5–15.5)
WBC: 6.9 10*3/uL (ref 4.0–10.5)

## 2014-03-23 LAB — LIPID PANEL
Cholesterol: 220 mg/dL — ABNORMAL HIGH (ref 0–200)
HDL: 41.5 mg/dL (ref >39.00)
LDL Cholesterol: 137 mg/dL — ABNORMAL HIGH (ref 0–99)
Total CHOL/HDL Ratio: 5
Triglycerides: 207 mg/dL — ABNORMAL HIGH (ref 0.0–149.0)
VLDL: 41.4 mg/dL — ABNORMAL HIGH (ref 0.0–40.0)

## 2014-03-23 LAB — TSH: TSH: 1.15 u[IU]/mL (ref 0.35–4.50)

## 2014-03-23 LAB — HEPATIC FUNCTION PANEL
ALT: 39 U/L (ref 0–53)
AST: 36 U/L (ref 0–37)
Albumin: 4.1 g/dL (ref 3.5–5.2)
Alkaline Phosphatase: 43 U/L (ref 39–117)
Bilirubin, Direct: 0.1 mg/dL (ref 0.0–0.3)
Total Bilirubin: 1 mg/dL (ref 0.2–1.2)
Total Protein: 7.7 g/dL (ref 6.0–8.3)

## 2014-03-23 LAB — PSA: PSA: 0.88 ng/mL (ref 0.10–4.00)

## 2014-03-30 ENCOUNTER — Encounter: Payer: Self-pay | Admitting: Internal Medicine

## 2014-03-30 ENCOUNTER — Ambulatory Visit (INDEPENDENT_AMBULATORY_CARE_PROVIDER_SITE_OTHER): Payer: BC Managed Care – PPO | Admitting: Internal Medicine

## 2014-03-30 VITALS — BP 110/80 | HR 95 | Temp 97.9°F | Ht 77.0 in | Wt 244.0 lb

## 2014-03-30 DIAGNOSIS — Z0000 Encounter for general adult medical examination without abnormal findings: Secondary | ICD-10-CM

## 2014-03-30 NOTE — Progress Notes (Signed)
Pre visit review using our clinic review tool, if applicable. No additional management support is needed unless otherwise documented below in the visit note. 

## 2014-03-30 NOTE — Progress Notes (Signed)
cpx  Past Medical History  Diagnosis Date  . HYPERLIPIDEMIA 01/08/2008  . Headache(784.0) 12/04/2007  . TRANSAMINASES, SERUM, ELEVATED 01/08/2008    History   Social History  . Marital Status: Single    Spouse Name: N/A    Number of Children: N/A  . Years of Education: N/A   Occupational History  . Not on file.   Social History Main Topics  . Smoking status: Former Smoker    Quit date: 08/22/2007  . Smokeless tobacco: Not on file  . Alcohol Use: 6.0 oz/week    10 Cans of beer per week  . Drug Use: Not on file  . Sexual Activity: Not on file   Other Topics Concern  . Not on file   Social History Narrative  . No narrative on file    No past surgical history on file.  Family History  Problem Relation Age of Onset  . Hypertension Mother     No Known Allergies  Current Outpatient Prescriptions on File Prior to Visit  Medication Sig Dispense Refill  . atorvastatin (LIPITOR) 10 MG tablet TAKE 1 TABLET EVERY DAY  90 tablet  0  . imipramine (TOFRANIL) 10 MG tablet TAKE 1 TABLET BY MOUTH AT BEDTIME AS NEEDED  20 tablet  2  . lisinopril-hydrochlorothiazide (PRINZIDE,ZESTORETIC) 20-25 MG per tablet TAKE 1 TABLET BY MOUTH DAILY.  90 tablet  1  . loratadine (CLARITIN) 10 MG tablet Take 10 mg by mouth daily.      Marland Kitchen omeprazole (PRILOSEC) 20 MG capsule TAKE 1 CAPSULE EVERY DAY  90 capsule  3   No current facility-administered medications on file prior to visit.     patient denies chest pain, shortness of breath, orthopnea. Denies lower extremity edema, abdominal pain, change in appetite, change in bowel movements. Patient denies rashes, musculoskeletal complaints. No other specific complaints in a complete review of systems.   BP 110/80  Pulse 95  Temp(Src) 97.9 F (36.6 C) (Oral)  Ht 6\' 5"  (1.956 m)  Wt 244 lb (110.678 kg)  BMI 28.93 kg/m2  SpO2 98%  well-developed well-nourished male in no acute distress. HEENT exam atraumatic, normocephalic, neck supple without  jugular venous distention. Chest clear to auscultation cardiac exam S1-S2 are regular. Abdominal exam overweight with bowel sounds, soft and nontender. Extremities no edema. Neurologic exam is alert with a normal gait.  Well visit- health maint utd He will resume DAILY- atorvastatin

## 2014-04-21 ENCOUNTER — Other Ambulatory Visit: Payer: Self-pay | Admitting: Internal Medicine

## 2014-05-15 ENCOUNTER — Other Ambulatory Visit: Payer: Self-pay | Admitting: Internal Medicine

## 2014-09-17 ENCOUNTER — Telehealth: Payer: Self-pay | Admitting: Internal Medicine

## 2014-09-17 NOTE — Telephone Encounter (Signed)
rx sent in electronically 

## 2014-09-17 NOTE — Telephone Encounter (Signed)
Pt request refill of the following: atorvastatin (LIPITOR) 10 MG tablet   [facility] [place]  [place]    Pt will establish with Hunter.Marland Kitchen

## 2014-09-21 ENCOUNTER — Other Ambulatory Visit: Payer: Self-pay | Admitting: Internal Medicine

## 2014-12-11 ENCOUNTER — Other Ambulatory Visit: Payer: Self-pay | Admitting: Internal Medicine

## 2014-12-24 ENCOUNTER — Ambulatory Visit (INDEPENDENT_AMBULATORY_CARE_PROVIDER_SITE_OTHER): Payer: BC Managed Care – PPO | Admitting: Family Medicine

## 2014-12-24 ENCOUNTER — Encounter: Payer: Self-pay | Admitting: Family Medicine

## 2014-12-24 VITALS — BP 120/90 | Temp 97.9°F | Wt 252.0 lb

## 2014-12-24 DIAGNOSIS — J309 Allergic rhinitis, unspecified: Secondary | ICD-10-CM | POA: Insufficient documentation

## 2014-12-24 DIAGNOSIS — G4730 Sleep apnea, unspecified: Secondary | ICD-10-CM

## 2014-12-24 DIAGNOSIS — I10 Essential (primary) hypertension: Secondary | ICD-10-CM

## 2014-12-24 DIAGNOSIS — E785 Hyperlipidemia, unspecified: Secondary | ICD-10-CM

## 2014-12-24 NOTE — Assessment & Plan Note (Signed)
Atorvastatin 10mg . Takes at least 4x a week-admits to noncompliance at 03/23/2014 about 1-2x a week when LDL was 137. Declines repeat lipids today. Discussed continued weight gain would make this more difficult to control. Discussed healthy eating/exercise. He agrees to repeat lipids at CPE or next visit

## 2014-12-24 NOTE — Progress Notes (Signed)
Trevor Reddish, MD Phone: [phone]  Subjective:  Patient presents today to establish care with me as their new primary care provider. Patient was formerly a patient of Dr. Leanne Chang. Chief complaint-noted.   Hypertension-mild poor control diastolic  BP Readings from Last 3 Encounters:  12/24/14 120/90  03/30/14 110/80  04/28/13 128/90  admits to weight gain after quitting smoking in 08.  Walks at work but no home exercise Home BP monitoring-no Compliant with medications-yes without side effects ROS-Denies any CP, HA, SOB, blurry vision, LE edema  Hyperlipidemia-mild poor control with twice a week atorvastatin 10mg  in past now 4x a week  Lab Results  Component Value Date   LDLCALC 137* 03/23/2014  Regular exercise: no advised ROS- no chest pain or shortness of breath. No myalgias  Sleep Apnea- poor control -Needs new cpap as multiple parts have broken on old machine. Wants new one through [facility]  Since he has not been using it, has noted increased daytime sleepiness.  ROS- snoring endorsed  The following were reviewed and entered/updated in epic: Past Medical History  Diagnosis Date  . HYPERLIPIDEMIA 01/08/2008  . Migraine 12/04/2007  . TRANSAMINASES, SERUM, ELEVATED 01/08/2008  . Hypertension    Patient Active Problem List   Diagnosis Date Noted  . Hypertension 11/13/2010    Priority: Medium  . Sleep apnea 01/07/2009    Priority: Medium  . Hyperlipidemia 01/08/2008    Priority: Medium  . Migraine 12/04/2007    Priority: Medium  . Allergic rhinitis 12/24/2014    Priority: Low  . GERD (gastroesophageal reflux disease) 11/14/1999    Priority: Low   Past Surgical History  Procedure Laterality Date  . None      Family History  Problem Relation Age of Onset  . Hypertension Mother   . Other Father     unknown history  . Asthma Sister    Medications- reviewed and updated Current Outpatient Prescriptions  Medication Sig Dispense Refill  .  atorvastatin (LIPITOR) 10 MG tablet TAKE 1 TABLET EVERY DAY 90 tablet 0  . lisinopril-hydrochlorothiazide (PRINZIDE,ZESTORETIC) 20-25 MG per tablet TAKE 1 TABLET BY MOUTH EVERY DAY 90 tablet 3  . loratadine (CLARITIN) 10 MG tablet Take 10 mg by mouth daily.    Marland Kitchen omeprazole (PRILOSEC) 20 MG capsule TAKE 1 CAPSULE EVERY DAY 90 capsule 1  . topiramate (TOPAMAX) 25 MG tablet     . imipramine (TOFRANIL) 10 MG tablet TAKE 1 TABLET BY MOUTH AT BEDTIME AS NEEDED (Patient not taking: Reported on 12/24/2014) 20 tablet 2   No current facility-administered medications for this visit.    Allergies-reviewed and updated No Known Allergies  History   Social History  . Marital Status: Single    Spouse Name: N/A  . Number of Children: N/A  . Years of Education: N/A   Social History Main Topics  . Smoking status: Former Smoker -- 1.00 packs/day for 22 years    Types: Cigarettes    Quit date: 08/22/2007  . Smokeless tobacco: Not on file  . Alcohol Use: 6.0 oz/week    10 Cans of beer per week  . Drug Use: Not on file  . Sexual Activity: Not on file   Other Topics Concern  . Not on file   Social History Narrative   Married (wife patient here). Son and daughter 70 and 20 in 2016.       Works as Market researcher as Teacher, English as a foreign language.       Hobbies: 4 wheeler, live in country in  [place]    ROS--See HPI   Objective: BP 120/90 mmHg  Temp(Src) 97.9 F (36.6 C)  Wt 252 lb (114.306 kg) Gen: NAD, resting comfortably HEENT: Mucous membranes are moist. Oropharynx normal.  CV: RRR no murmurs rubs or gallops Lungs: CTAB no crackles, wheeze, rhonchi Abdomen: soft/nontender/nondistended/normal bowel sounds. No rebound or guarding.  Ext: no edema Skin: warm, dry, no rash  Neuro: grossly normal, moves all extremities, PERRLA   Assessment/Plan:  Hypertension Mild poor diastolic control today on Lisinopril hctz 20-25mg . Advised weight loss, healthy eating, regular exercise to avoid addition  of more and more medications in the long run.   Sleep apnea Poor control as machine not working. Wrote rx for new cpap today as old machine nonfunctioning that he bought 5 years ago. Will get at [facility] Use old settings   Hyperlipidemia Atorvastatin 10mg . Takes at least 4x a week-admits to noncompliance at 03/23/2014 about 1-2x a week when LDL was 137. Declines repeat lipids today. Discussed continued weight gain would make this more difficult to control. Discussed healthy eating/exercise. He agrees to repeat lipids at CPE or next visit   Return precautions advised. CPE 6 months.

## 2014-12-24 NOTE — Assessment & Plan Note (Addendum)
Poor control as machine not working. Wrote rx for new cpap today as old machine nonfunctioning that he bought 5 years ago. Will get at [facility] Use old settings

## 2014-12-24 NOTE — Patient Instructions (Addendum)
Bottom blood pressure # a hair high. Recommend regular exercise, healthy eating, about a pound of weight loss a week until you get down to 200-210. See eating plan below.   Wrote prescription for new cpap, they can contact us if further information needed.   Physical in 6 months, work hard on the weight or we will be likely starting more medication DASH Eating Plan DASH stands for "Dietary Approaches to Stop Hypertension." The DASH eating plan is a healthy eating plan that has been shown to reduce high blood pressure (hypertension). Additional health benefits may include reducing the risk of type 2 diabetes mellitus, heart disease, and stroke. The DASH eating plan may also help with weight loss. WHAT DO I NEED TO KNOW ABOUT THE DASH EATING PLAN? For the DASH eating plan, you will follow these general guidelines:  Choose foods with a percent daily value for sodium of less than 5% (as listed on the food label).  Use salt-free seasonings or herbs instead of table salt or sea salt.  Check with your health care provider or pharmacist before using salt substitutes.  Eat lower-sodium products, often labeled as "lower sodium" or "no salt added."  Eat fresh foods.  Eat more vegetables, fruits, and low-fat dairy products.  Choose whole grains. Look for the word "whole" as the first word in the ingredient list.  Choose fish and skinless chicken or [place] more often than red meat. Limit fish, poultry, and meat to 6 oz (170 g) each day.  Limit sweets, desserts, sugars, and sugary drinks.  Choose heart-healthy fats.  Limit cheese to 1 oz (28 g) per day.  Eat more home-cooked food and less restaurant, buffet, and fast food.  Limit fried foods.  Cook foods using methods other than frying.  Limit canned vegetables. If you do use them, rinse them well to decrease the sodium.  When eating at a restaurant, ask that your food be prepared with less salt, or no salt if possible. WHAT FOODS CAN I  EAT? Seek help from a dietitian for individual calorie needs. Grains Whole grain or whole wheat bread. Brown rice. Whole grain or whole wheat pasta. Quinoa, bulgur, and whole grain cereals. Low-sodium cereals. Corn or whole wheat flour tortillas. Whole grain cornbread. Whole grain crackers. Low-sodium crackers. Vegetables Fresh or frozen vegetables (raw, steamed, roasted, or grilled). Low-sodium or reduced-sodium tomato and vegetable juices. Low-sodium or reduced-sodium tomato sauce and paste. Low-sodium or reduced-sodium canned vegetables.  Fruits All fresh, canned (in natural juice), or frozen fruits. Meat and Other Protein Products Ground beef (85% or leaner), grass-fed beef, or beef trimmed of fat. Skinless chicken or [place]. Ground chicken or [place]. Pork trimmed of fat. All fish and seafood. Eggs. Dried beans, peas, or lentils. Unsalted nuts and seeds. Unsalted canned beans. Dairy Low-fat dairy products, such as skim or 1% milk, 2% or reduced-fat cheeses, low-fat ricotta or cottage cheese, or plain low-fat yogurt. Low-sodium or reduced-sodium cheeses. Fats and Oils Tub margarines without trans fats. Light or reduced-fat mayonnaise and salad dressings (reduced sodium). Avocado. Safflower, olive, or canola oils. Natural peanut or almond butter. Other Unsalted popcorn and pretzels. The items listed above may not be a complete list of recommended foods or beverages. Contact your dietitian for more options. WHAT FOODS ARE NOT RECOMMENDED? Grains White bread. White pasta. White rice. Refined cornbread. Bagels and croissants. Crackers that contain trans fat. Vegetables Creamed or fried vegetables. Vegetables in a cheese sauce. Regular canned vegetables. Regular canned tomato sauce and paste. Regular  tomato and vegetable juices. Fruits Dried fruits. Canned fruit in light or heavy syrup. Fruit juice. Meat and Other Protein Products Fatty cuts of meat. Ribs, chicken wings, bacon, sausage,  bologna, salami, chitterlings, fatback, hot dogs, bratwurst, and packaged luncheon meats. Salted nuts and seeds. Canned beans with salt. Dairy Whole or 2% milk, cream, half-and-half, and cream cheese. Whole-fat or sweetened yogurt. Full-fat cheeses or blue cheese. Nondairy creamers and whipped toppings. Processed cheese, cheese spreads, or cheese curds. Condiments Onion and garlic salt, seasoned salt, table salt, and sea salt. Canned and packaged gravies. Worcestershire sauce. Tartar sauce. Barbecue sauce. Teriyaki sauce. Soy sauce, including reduced sodium. Steak sauce. Fish sauce. Oyster sauce. Cocktail sauce. Horseradish. Ketchup and mustard. Meat flavorings and tenderizers. Bouillon cubes. Hot sauce. Tabasco sauce. Marinades. Taco seasonings. Relishes. Fats and Oils Butter, stick margarine, lard, shortening, ghee, and bacon fat. Coconut, palm kernel, or palm oils. Regular salad dressings. Other Pickles and olives. Salted popcorn and pretzels. The items listed above may not be a complete list of foods and beverages to avoid. Contact your dietitian for more information. WHERE CAN I FIND MORE INFORMATION? National Heart, Lung, and Blood Institute: travelstabloid.com Document Released: 10/19/2011 Document Revised: 03/16/2014 Document Reviewed: 09/03/2013 [facility] Patient Information 2015 [place], [place]. This information is not intended to replace advice given to you by your health care provider. Make sure you discuss any questions you have with your health care provider.

## 2014-12-24 NOTE — Assessment & Plan Note (Signed)
Mild poor diastolic control today on Lisinopril hctz 20-25mg . Advised weight loss, healthy eating, regular exercise to avoid addition of more and more medications in the long run.

## 2015-02-03 ENCOUNTER — Other Ambulatory Visit: Payer: Self-pay | Admitting: Internal Medicine

## 2015-05-05 ENCOUNTER — Other Ambulatory Visit: Payer: Self-pay | Admitting: Internal Medicine

## 2015-05-05 ENCOUNTER — Other Ambulatory Visit: Payer: Self-pay | Admitting: Family Medicine

## 2015-05-27 ENCOUNTER — Other Ambulatory Visit (INDEPENDENT_AMBULATORY_CARE_PROVIDER_SITE_OTHER): Payer: BLUE CROSS/BLUE SHIELD

## 2015-05-27 DIAGNOSIS — Z0000 Encounter for general adult medical examination without abnormal findings: Secondary | ICD-10-CM

## 2015-05-27 LAB — PSA: PSA: 0.87 ng/mL (ref 0.10–4.00)

## 2015-05-27 LAB — CBC WITH DIFFERENTIAL/PLATELET
Basophils Absolute: 0 10*3/uL (ref 0.0–0.1)
Basophils Relative: 0.5 % (ref 0.0–3.0)
Eosinophils Absolute: 0.2 10*3/uL (ref 0.0–0.7)
Eosinophils Relative: 2.5 % (ref 0.0–5.0)
HCT: 44.1 % (ref 39.0–52.0)
Hemoglobin: 15 g/dL (ref 13.0–17.0)
Lymphocytes Relative: 34.7 % (ref 12.0–46.0)
Lymphs Abs: 2.7 10*3/uL (ref 0.7–4.0)
MCHC: 34 g/dL (ref 30.0–36.0)
MCV: 87.6 fl (ref 78.0–100.0)
Monocytes Absolute: 0.5 10*3/uL (ref 0.1–1.0)
Monocytes Relative: 6.1 % (ref 3.0–12.0)
Neutro Abs: 4.4 10*3/uL (ref 1.4–7.7)
Neutrophils Relative %: 56.2 % (ref 43.0–77.0)
Platelets: 314 10*3/uL (ref 150.0–400.0)
RBC: 5.04 Mil/uL (ref 4.22–5.81)
RDW: 13.7 % (ref 11.5–15.5)
WBC: 7.8 10*3/uL (ref 4.0–10.5)

## 2015-05-27 LAB — POCT URINALYSIS DIPSTICK
Bilirubin, UA: NEGATIVE
Blood, UA: NEGATIVE
Glucose, UA: NEGATIVE
Ketones, UA: NEGATIVE
Leukocytes, UA: NEGATIVE
Nitrite, UA: NEGATIVE
Protein, UA: NEGATIVE
Spec Grav, UA: 1.02
Urobilinogen, UA: 0.2
pH, UA: 5.5

## 2015-05-27 LAB — BASIC METABOLIC PANEL
BUN: 17 mg/dL (ref 6–23)
CO2: 29 mEq/L (ref 19–32)
Calcium: 9.4 mg/dL (ref 8.4–10.5)
Chloride: 100 mEq/L (ref 96–112)
Creatinine, Ser: 1.23 mg/dL (ref 0.40–1.50)
GFR: 80.95 mL/min (ref >60.00)
Glucose, Bld: 124 mg/dL — ABNORMAL HIGH (ref 70–99)
Potassium: 3.9 mEq/L (ref 3.5–5.1)
Sodium: 135 mEq/L (ref 135–145)

## 2015-05-27 LAB — LIPID PANEL
Cholesterol: 198 mg/dL (ref 0–200)
HDL: 38.6 mg/dL — ABNORMAL LOW (ref >39.00)
LDL Cholesterol: 133 mg/dL — ABNORMAL HIGH (ref 0–99)
NonHDL: 159.4
Total CHOL/HDL Ratio: 5
Triglycerides: 131 mg/dL (ref 0.0–149.0)
VLDL: 26.2 mg/dL (ref 0.0–40.0)

## 2015-05-27 LAB — HEPATIC FUNCTION PANEL
ALT: 36 U/L (ref 0–53)
AST: 27 U/L (ref 0–37)
Albumin: 4.1 g/dL (ref 3.5–5.2)
Alkaline Phosphatase: 55 U/L (ref 39–117)
Bilirubin, Direct: 0.1 mg/dL (ref 0.0–0.3)
Total Bilirubin: 0.7 mg/dL (ref 0.2–1.2)
Total Protein: 7.4 g/dL (ref 6.0–8.3)

## 2015-05-27 LAB — TSH: TSH: 1.09 u[IU]/mL (ref 0.35–4.50)

## 2015-06-03 ENCOUNTER — Ambulatory Visit (INDEPENDENT_AMBULATORY_CARE_PROVIDER_SITE_OTHER): Payer: BLUE CROSS/BLUE SHIELD | Admitting: Family Medicine

## 2015-06-03 ENCOUNTER — Encounter: Payer: Self-pay | Admitting: Family Medicine

## 2015-06-03 VITALS — BP 130/80 | HR 83 | Temp 98.7°F | Wt 248.0 lb

## 2015-06-03 DIAGNOSIS — Z0000 Encounter for general adult medical examination without abnormal findings: Secondary | ICD-10-CM | POA: Diagnosis not present

## 2015-06-03 DIAGNOSIS — Z87891 Personal history of nicotine dependence: Secondary | ICD-10-CM | POA: Insufficient documentation

## 2015-06-03 MED ORDER — LISINOPRIL-HYDROCHLOROTHIAZIDE 20-25 MG PO TABS: 1.0000 | ORAL_TABLET | Freq: Every day | ORAL | Status: DC

## 2015-06-03 MED ORDER — OMEPRAZOLE 20 MG PO CPDR: 20.0000 mg | DELAYED_RELEASE_CAPSULE | Freq: Every day | ORAL | Status: DC

## 2015-06-03 MED ORDER — ATORVASTATIN CALCIUM 10 MG PO TABS: 10.0000 mg | ORAL_TABLET | Freq: Every day | ORAL | Status: DC

## 2015-06-03 NOTE — Progress Notes (Signed)
Trevor Reddish, MD Phone: [phone]  Subjective:  Patient presents today for their annual physical. Chief complaint-noted.   Lost 4 lbs as job more active  About 4-5 days a week remembering to take BP and lipid meds. Encouraged compliance  No regular exercise. Advised needs this.   On no meds for migraines. Plans to return to see Dr. Melton Alar. Still about 1 per 3 months  Now compliant with PAP again  GERD controlled on PPI  Was not fasting- had kool aid before labs  ROS- full  review of systems was completed and negative except for: HA as mentioned above  The following were reviewed and entered/updated in epic: Past Medical History  Diagnosis Date  . HYPERLIPIDEMIA 01/08/2008  . Migraine 12/04/2007  . TRANSAMINASES, SERUM, ELEVATED 01/08/2008    resolved on repeat  . Hypertension    Patient Active Problem List   Diagnosis Date Noted  . Hypertension 11/13/2010    Priority: Medium  . Sleep apnea 01/07/2009    Priority: Medium  . Hyperlipidemia 01/08/2008    Priority: Medium  . Migraine 12/04/2007    Priority: Medium  . Former smoker 06/03/2015    Priority: Low  . Allergic rhinitis 12/24/2014    Priority: Low  . GERD (gastroesophageal reflux disease) 11/14/1999    Priority: Low   Past Surgical History  Procedure Laterality Date  . None      Family History  Problem Relation Age of Onset  . Hypertension Mother   . Other Father     unknown history  . Asthma Sister     Medications- reviewed and updated Current Outpatient Prescriptions  Medication Sig Dispense Refill  . atorvastatin (LIPITOR) 10 MG tablet TAKE 1 TABLET BY MOUTH EVERY DAY 90 tablet 2  . lisinopril-hydrochlorothiazide (PRINZIDE,ZESTORETIC) 20-25 MG per tablet TAKE 1 TABLET BY MOUTH EVERY DAY 90 tablet 1  . loratadine (CLARITIN) 10 MG tablet Take 10 mg by mouth daily.    Marland Kitchen omeprazole (PRILOSEC) 20 MG capsule TAKE 1 CAPSULE EVERY DAY 90 capsule 1   Allergies-reviewed and updated No Known  Allergies  History   Social History  . Marital Status: Single    Spouse Name: N/A  . Number of Children: N/A  . Years of Education: N/A   Social History Main Topics  . Smoking status: Former Smoker -- 1.00 packs/day for 22 years    Types: Cigarettes    Quit date: 08/22/2007  . Smokeless tobacco: Not on file  . Alcohol Use: 6.0 oz/week    10 Cans of beer per week  . Drug Use: Not on file  . Sexual Activity: Not on file   Other Topics Concern  . None   Social History Narrative   Married (wife patient here). Son and daughter 57 and 75 in 2016.       Works as Market researcher as Marketing executive      Hobbies: 4 wheeler, live in country in [place]    ROS--See HPI   Objective: BP 130/80 mmHg  Pulse 83  Temp(Src) 98.7 F (37.1 C)  Wt 248 lb (112.492 kg) Gen: NAD, resting comfortably HEENT: Mucous membranes are moist. Oropharynx normal Neck: no thyromegaly CV: RRR no murmurs rubs or gallops Lungs: CTAB no crackles, wheeze, rhonchi Abdomen: soft/nontender/nondistended/normal bowel sounds. No rebound or guarding.  Rectal: normal tone, normal prostate, no masses or tenderness Ext: no edema Skin: warm, dry Neuro: grossly normal, moves all extremities, PERRLA   Assessment/Plan:  49 y.o. male presenting for  annual physical.  Health Maintenance counseling: 1. Anticipatory guidance: Patient counseled regarding regular dental exams, wearing seatbelts 2. 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 Also working on baking and grilling instead of fried food. 3. Immunizations/screenings/ancillary studies Health Maintenance Due  Topic Date Due  . HIV Screening - try to do with next labs 11/29/1982  4. Not at age to colon cancer screening with negative family history 5. Prostate cancer  screening- low risk based off rectal  and PSA. Patient wants to consider every other year testing.   6 month f/u   Meds ordered this encounter  Medications  . omeprazole (PRILOSEC) 20 MG capsule    Sig: Take 1 capsule (20 mg total) by mouth daily.    Dispense:  90 capsule    Refill:  3  . lisinopril-hydrochlorothiazide (PRINZIDE,ZESTORETIC) 20-25 MG per tablet    Sig: Take 1 tablet by mouth daily.    Dispense:  90 tablet    Refill:  3  . atorvastatin (LIPITOR) 10 MG tablet    Sig: Take 1 tablet (10 mg total) by mouth daily.    Dispense:  90 tablet    Refill:  3

## 2015-06-03 NOTE — Patient Instructions (Addendum)
Great job losing weight. Let's try to add in aerobic exercise (getting HR up for at least 30 minutes at least 3 x a week).   Try to take cholesterol medicine daily and not miss doses  Dont double up on blood pressure medicine if you miss a dose, but try not to miss a dose as well  congrats on promotion!   Refilled meds for a year but happy to check in 6 months from now with you to see how blood pressure doing.

## 2015-10-28 ENCOUNTER — Telehealth: Payer: Self-pay | Admitting: Family Medicine

## 2015-10-28 NOTE — Telephone Encounter (Signed)
See below and get pt scheduled for tomorrow morning please.

## 2015-10-28 NOTE — Telephone Encounter (Signed)
Split tomorrow AM new patient into 2 acutes please and place him in 1 of them if he is able to come then

## 2015-10-28 NOTE — Telephone Encounter (Signed)
Pt called saying he fell a week ago on a step and fell on his left knee. The pain is still consistent and he has an on and off burning sensation. He said it's not as swollen as it was initially but he's wondering if he can be worked in with Dr. Yong Channel. Please give him a call regarding this.  Pt's ph# [phone]. Thank you.

## 2015-10-28 NOTE — Telephone Encounter (Signed)
See below

## 2015-10-29 ENCOUNTER — Ambulatory Visit (INDEPENDENT_AMBULATORY_CARE_PROVIDER_SITE_OTHER): Payer: BLUE CROSS/BLUE SHIELD | Admitting: Family Medicine

## 2015-10-29 ENCOUNTER — Encounter: Payer: Self-pay | Admitting: Family Medicine

## 2015-10-29 VITALS — BP 138/70 | HR 79 | Temp 98.5°F | Wt 260.0 lb

## 2015-10-29 DIAGNOSIS — M25562 Pain in left knee: Secondary | ICD-10-CM | POA: Diagnosis not present

## 2015-10-29 DIAGNOSIS — S83282A Other tear of lateral meniscus, current injury, left knee, initial encounter: Secondary | ICD-10-CM | POA: Diagnosis not present

## 2015-10-29 MED ORDER — MELOXICAM 15 MG PO TABS: 15.0000 mg | ORAL_TABLET | Freq: Every day | ORAL | Status: DC

## 2015-10-29 NOTE — Progress Notes (Signed)
Garret Reddish, MD  Subjective:  Trevor Flowers is a 49 y.o. year old very pleasant male patient who presents for/with See problem oriented charting ROS- no fever, chills, nausea, vomiting. No leg weakness. No rash around the knee.  Past Medical History-  Patient Active Problem List   Diagnosis Date Noted  . Hypertension 11/13/2010    Priority: Medium  . Sleep apnea 01/07/2009    Priority: Medium  . Hyperlipidemia 01/08/2008    Priority: Medium  . Migraine 12/04/2007    Priority: Medium  . Former smoker 06/03/2015    Priority: Low  . Allergic rhinitis 12/24/2014    Priority: Low  . GERD (gastroesophageal reflux disease) 11/14/1999    Priority: Low    Medications- reviewed and updated Current Outpatient Prescriptions  Medication Sig Dispense Refill  . atorvastatin (LIPITOR) 10 MG tablet Take 1 tablet (10 mg total) by mouth daily. 90 tablet 3  . lisinopril-hydrochlorothiazide (PRINZIDE,ZESTORETIC) 20-25 MG per tablet Take 1 tablet by mouth daily. 90 tablet 3  . loratadine (CLARITIN) 10 MG tablet Take 10 mg by mouth daily.    Marland Kitchen omeprazole (PRILOSEC) 20 MG capsule Take 1 capsule (20 mg total) by mouth daily. 90 capsule 3   No current facility-administered medications for this visit.    Objective: BP 138/70 mmHg  Pulse 79  Temp(Src) 98.5 F (36.9 C)  Wt 260 lb (117.935 kg) Gen: NAD, resting comfortably CV: RRR no murmurs rubs or gallops Lungs: CTAB no crackles, wheeze, rhonchi Ext: no pretibial edema, no cyanosis Skin: warm, dry, no rash Neuro: grossly normal, moves all extremities  Left Knee: Largely Normal to inspection with no erythema or effusion or obvious bony abnormalities. Palpation reveals no warmth or joint line tenderness. Does have mild effusion on exam.  ROM normal in flexion and extension and lower leg rotation. Ligaments with solid consistent endpoints including ACL, PCL, LCL, MCL. Positive Mcmurray's when lateral meniscus is stressed.  Non  painful patellar compression. Patellar and quadriceps tendons unremarkable. Hamstring and quadriceps strength is normal.  Assessment/Plan:  Left Knee Pain Suspected lateral meniscus injury/suspected tear S: few weeks ago was walking up onto his porch in the rain. Some Cheerios had been left out for his dog. His right leg slipped out on this and he caught himself with his left leg. He did not fall but did feel immediate severe pain. Within an hour his knee was swollen. Had severe pain for 2 or 3 days but with icing improved to moderate aching. He continues to have issues with bending or squatting. Sometimes he feels a click or a pop. He tried to tolerate this but it just has not improved. Complains presently of moderate aching sensation. Tried some ibuprofen which helped some. No weakness in the leg. No giving way in the leg. He is trying an old knee brace as well A/P: Exam is consistent with a lateral meniscus injury/tear with associated mild effusion. I advise sports medicine referral but patient would like to try conservative measures first. We will use 10-14 days of mobic as well as continued icing. Given patient an Ace wrap to apply as well. He agrees that he is not at least 50% improved by the new year he will call me and I will place sports medicine referral.  Return precautions advised.   Meds ordered this encounter  Medications  . meloxicam (MOBIC) 15 MG tablet    Sig: Take 1 tablet (15 mg total) by mouth daily.    Dispense:  30 tablet  Refill:  0

## 2015-10-29 NOTE — Patient Instructions (Signed)
Continue to ice at least 1-2x a day for 20 minutes.   Use ace wrap daily- start from bottom  Use antiinflammatory for 10-14 days. Gave you 30 days in case we ever have to use it again and likely same cost as 10-15 pills.   Call me January 1st- if continue to have issues and not at least 50% improved refer to sports medicine    Meniscus Tear A meniscus tear is a knee injury in which a piece of the meniscus is torn. The meniscus is a thick, rubbery, wedge-shaped cartilage in the knee. Two menisci are located in each knee. They sit between the upper bone (femur) and lower bone (tibia) that make up the knee joint. Each meniscus acts as a shock absorber for the knee. A torn meniscus is one of the most common types of knee injuries. This injury can range from mild to severe. Surgery may be needed for a severe tear. CAUSES This injury may be caused by any squatting, twisting, or pivoting movement. Sports-related injuries are the most common cause. These often occur from:  Running and stopping suddenly.  Changing direction.  Being tackled or knocked off your feet. As people get older, their meniscus gets thinner and weaker. In these people, tears can happen more easily, such as from climbing stairs.  RISK FACTORS This injury is more likely to happen to:  People who play contact sports.  Males.  People who are 64-5 years of age. SYMPTOMS  Symptoms of this injury include:  Knee pain, especially at the side of the knee joint. You may feel pain when the injury occurs, or you may only hear a pop and feel pain later.  A feeling that your knee is clicking, catching, locking, or giving way.  Not being able to fully bend or extend your knee.  Bruising or swelling in your knee. DIAGNOSIS  This injury may be diagnosed based on your symptoms and a physical exam. The physical exam may include:  Moving your knee in different ways.  Feeling for tenderness.  Listening for a clicking  sound.  Checking if your knee locks or catches. You may also have tests, such as:  X-rays.  MRI.  A procedure to look inside your knee with a narrow surgical telescope (arthroscopy). You may be referred to a knee specialist (orthopedic surgeon). TREATMENT  Treatment for this injury depends on the severity of the tear. Treatment for a mild tear may include:  Rest.  Medicine to reduce pain and swelling. This is usually a nonsteroidal anti-inflammatory drug (NSAID).  A knee brace or an elastic sleeve or wrap.  Using crutches or a walker to keep weight off your knee and to help you walk.  Exercises to strengthen your knee (physical therapy). You may need surgery if you have a severe tear or if other treatments are not working.  HOME CARE INSTRUCTIONS Managing Pain and Swelling  Take over-the-counter and prescription medicines only as told by your health care provider.  If directed, apply ice to the injured area:  Put ice in a plastic bag.  Place a towel between your skin and the bag.  Leave the ice on for 20 minutes, 2-3 times per day.  Raise (elevate) the injured area above the level of your heart while you are sitting or lying down. Activity  Do not use the injured limb to support your body weight until your health care provider says that you can. Use crutches or a walker as told by your  health care provider.  Return to your normal activities as told by your health care provider. Ask your health care provider what activities are safe for you.  Perform range-of-motion exercises only as told by your health care provider.  Begin doing exercises to strengthen your knee and leg muscles only as told by your health care provider. After you recover, your health care provider may recommend these exercises to help prevent another injury. General Instructions  Use a knee brace or elastic wrap as told by your health care provider.  Keep all follow-up visits as told by your health  care provider. This is important. SEEK MEDICAL CARE IF:  You have a fever.  Your knee becomes red, tender, or swollen.  Your pain medicine is not helping.  Your symptoms get worse or do not improve after 2 weeks of home care.   This information is not intended to replace advice given to you by your health care provider. Make sure you discuss any questions you have with your health care provider.   Document Released: 01/20/2003 Document Revised: 07/21/2015 Document Reviewed: 02/22/2015 Elsevier Interactive Patient Education [employer].

## 2015-10-29 NOTE — Telephone Encounter (Signed)
Pt is scheduled for an appt today at 11:30am. Thank you.

## 2015-11-01 ENCOUNTER — Telehealth: Payer: Self-pay | Admitting: Family Medicine

## 2015-11-01 DIAGNOSIS — M25562 Pain in left knee: Secondary | ICD-10-CM

## 2015-11-01 NOTE — Telephone Encounter (Signed)
May refer to sports medicine Keba. Make sure he is not seen until January- insurance change

## 2015-11-01 NOTE — Telephone Encounter (Signed)
Pt call to say that the appt to see someone about his knee can be scheduled for Jan 2017

## 2015-11-01 NOTE — Telephone Encounter (Signed)
Referral placed.

## 2015-11-17 ENCOUNTER — Encounter: Payer: Self-pay | Admitting: Family Medicine

## 2015-11-17 ENCOUNTER — Other Ambulatory Visit (INDEPENDENT_AMBULATORY_CARE_PROVIDER_SITE_OTHER): Payer: BLUE CROSS/BLUE SHIELD

## 2015-11-17 ENCOUNTER — Ambulatory Visit (INDEPENDENT_AMBULATORY_CARE_PROVIDER_SITE_OTHER): Payer: BLUE CROSS/BLUE SHIELD | Admitting: Family Medicine

## 2015-11-17 VITALS — BP 128/84 | Ht 76.0 in | Wt 255.0 lb

## 2015-11-17 DIAGNOSIS — M769 Unspecified enthesopathy, lower limb, excluding foot: Secondary | ICD-10-CM

## 2015-11-17 DIAGNOSIS — M25562 Pain in left knee: Secondary | ICD-10-CM

## 2015-11-17 DIAGNOSIS — M76899 Other specified enthesopathies of unspecified lower limb, excluding foot: Secondary | ICD-10-CM | POA: Insufficient documentation

## 2015-11-17 MED ORDER — DICLOFENAC SODIUM 2 % TD SOLN: TRANSDERMAL | Status: DC

## 2015-11-17 NOTE — Assessment & Plan Note (Signed)
I do believe the patient has more of a tendinitis as well as having some small intersubstance tearing from patient spurring of the superior aspect of the patella. I do think that this was likely contributing to this and this is likely an exacerbation of an underlying problem that has been there. I do not see any significant joint or intra-articular pathology that be contributing at this time. We discussed different treatment options and patient has elected try conservative therapy. Patient will try bracing, icing, topical anti-inflammatories and prescription was sent in. We discussed what activities potentially avoid. Patient declined formal physical therapy at this time. Patient come back in 3-4 weeks for further evaluation and treatment. Do not feel that further imaging is necessary.

## 2015-11-17 NOTE — Progress Notes (Signed)
[facility] [address] [place], [place] [address] Phone: [phone] Subjective:    I'm seeing this patient by the request  of:  Garret Reddish, MD   CC: Left knee pain  [id number] Trevor Flowers is a 50 y.o. male coming in with complaint of left knee pain. Patient has had this pain for some time but seemed to worsen after patient did slip on some water. Patient actually was able to stabilize on his left knee but did have pain. Now seems to give him trouble when he is flexing the knee or going up or down stairs. Patient states it seems to be right on the anterior aspect of the knee. Somewhat proximal to the knee He points. States that sometimes fast motions can cause some pain. Has been taking anti-inflammatory with mild improvement. Not stopping him from activities but is uncomfortable. No pain at night. Rates the severity of pain though is 7 out of 10. Patient states he is had this problem somewhat before but not as much     Past Medical History  Diagnosis Date  . HYPERLIPIDEMIA 01/08/2008  . Migraine 12/04/2007  . TRANSAMINASES, SERUM, ELEVATED 01/08/2008    resolved on repeat  . Hypertension    Past Surgical History  Procedure Laterality Date  . None     Social History   Social History  . Marital Status: Single    Spouse Name: N/A  . Number of Children: N/A  . Years of Education: N/A   Social History Main Topics  . Smoking status: Former Smoker -- 1.00 packs/day for 22 years    Types: Cigarettes    Quit date: 08/22/2007  . Smokeless tobacco: None  . Alcohol Use: 6.0 oz/week    10 Cans of beer per week  . Drug Use: None  . Sexual Activity: Not Asked   Other Topics Concern  . None   Social History Narrative   Married (wife patient here). Son and daughter 3 and 59 in 2016.       Works as Market researcher as Marketing executive      Hobbies: 4 wheeler, live in country in [place]   No Known  Allergies Family History  Problem Relation Age of Onset  . Hypertension Mother   . Other Father     unknown history  . Asthma Sister     Past medical history, social, surgical and family history all reviewed in electronic medical record.  No pertanent information unless stated regarding to the chief complaint.   Review of Systems: No headache, visual changes, nausea, vomiting, diarrhea, constipation, dizziness, abdominal pain, skin rash, fevers, chills, night sweats, weight loss, swollen lymph nodes, body aches, joint swelling, muscle aches, chest pain, shortness of breath, mood changes.   Objective Blood pressure 128/84, height 6\' 4"  (1.93 m), weight 255 lb (115.667 kg).  General: No apparent distress alert and oriented x3 mood and affect normal, dressed appropriately.  HEENT: Pupils equal, extraocular movements intact  Respiratory: Patient's speak in full sentences and does not appear short of breath  Cardiovascular: No lower extremity edema, non tender, no erythema  Skin: Warm dry intact with no signs of infection or rash on extremities or on axial skeleton.  Abdomen: Soft nontender  Neuro: Cranial nerves II through XII are intact, neurovascularly intact in all extremities with 2+ DTRs and 2+ pulses.  Lymph: No lymphadenopathy of posterior or anterior cervical chain or axillae bilaterally.  Gait normal with good balance  and coordination.  MSK:  Non tender with full range of motion and good stability and symmetric strength and tone of shoulders, elbows, wrist, hip, and ankles bilaterally.  Knee: Left  Normal to inspection with no erythema or effusion or obvious bony abnormalities. Tender over the quad tendon at insertion.  ROM full in flexion and extension and lower leg rotation. Ligaments with solid consistent endpoints including ACL, PCL, LCL, MCL. Negative Mcmurray's, Apley's, and Thessalonian tests. Mild painful patellar compression. Patellar glide with moderate crepitus.  crepitus. Patellar and quadriceps tendons unremarkable. Hamstring and quadriceps strength is normal.  Contralateral knee unremarkable   MSK US performed of: Left knee  This study was ordered, performed, and interpreted by Charlann Boxer D.O.  Knee: All structures visualized. Anteromedial, anterolateral, posteromedial, and posterolateral menisci unremarkable without tearing, fraying, effusion, or displacement. Patellar Tendon unremarkable on long and transverse views without effusion. Quad tendon shows Hypoechoic changes at insertion and calcific intersubstance deposits. Spurring of patella noted as well.   No abnormality of prepatellar bursa. LCL and MCL unremarkable on long and transverse views. No abnormality of origin of medial or lateral head of the gastrocnemius.  IMPRESSION:  Severe tendinitis as well as some calcific changes in intersubstance tearing with spurring of the superior aspect of the patella  Procedure note [id number]; 15 minutes spent for Therapeutic exercises as stated in above notes.  This included exercises focusing on stretching, strengthening, with significant focus on eccentric aspects.  Flexion, extension with eccentric exercises and quad strengthening of VMO and hamstring stretching.  Proper technique shown and discussed handout in great detail with ATC.  All questions were discussed and answered.      Impression and Recommendations:     This case required medical decision making of moderate complexity.      Note: This dictation was prepared with Dragon dictation along with smaller phrase technology. Any transcriptional errors that result from this process are unintentional.

## 2015-11-17 NOTE — Patient Instructions (Signed)
Good to se you Quad tendonitis Ice 20 minutes 2 times daily. Usually after activity and before bed. Exercises 3 times a week.  pennsaid pinkie amount topically 2 times daily as needed.  Turmeric 500mg  twice daily Vitamin D 2000 IU daily See me again in 3-4 weeks.

## 2015-11-17 NOTE — Progress Notes (Signed)
Pre visit review using our clinic review tool, if applicable. No additional management support is needed unless otherwise documented below in the visit note. 

## 2015-12-10 ENCOUNTER — Encounter: Payer: Self-pay | Admitting: Family Medicine

## 2015-12-10 ENCOUNTER — Ambulatory Visit (INDEPENDENT_AMBULATORY_CARE_PROVIDER_SITE_OTHER): Payer: BLUE CROSS/BLUE SHIELD | Admitting: Family Medicine

## 2015-12-10 ENCOUNTER — Ambulatory Visit (INDEPENDENT_AMBULATORY_CARE_PROVIDER_SITE_OTHER): Payer: BLUE CROSS/BLUE SHIELD

## 2015-12-10 VITALS — BP 126/84 | HR 73 | Ht 76.0 in | Wt 256.0 lb

## 2015-12-10 VITALS — BP 124/74 | Temp 97.9°F | Wt 259.0 lb

## 2015-12-10 DIAGNOSIS — M769 Unspecified enthesopathy, lower limb, excluding foot: Secondary | ICD-10-CM | POA: Diagnosis not present

## 2015-12-10 DIAGNOSIS — M76899 Other specified enthesopathies of unspecified lower limb, excluding foot: Secondary | ICD-10-CM

## 2015-12-10 DIAGNOSIS — I10 Essential (primary) hypertension: Secondary | ICD-10-CM

## 2015-12-10 DIAGNOSIS — E785 Hyperlipidemia, unspecified: Secondary | ICD-10-CM | POA: Diagnosis not present

## 2015-12-10 NOTE — Assessment & Plan Note (Signed)
S: controlled. On  Lisinopril hctz 20-25mg  BP Readings from Last 3 Encounters:  12/10/15 124/74  12/10/15 126/84  11/17/15 128/84  A/P:Continue current meds:  Continue weight loss efforts- down 1 lb over holidays- plan for 240 by next physical

## 2015-12-10 NOTE — Patient Instructions (Signed)
Good to see you  Ice still can be good Do the vitamin D 2000 IU daily.  It could help with the bone spur Try a patellar strap on the inferior aspect of the knee. Or the bottom of the knee cap.  See me again in 6 weeks if not perfect

## 2015-12-10 NOTE — Progress Notes (Signed)
Garret Reddish, MD  Subjective:  Trevor Flowers is a 50 y.o. year old very pleasant male patient who presents for/with See problem oriented charting ROS- No chest pain or shortness of breath. No headache or blurry vision. Continues to have knee pain and following with Dr. Tamala Julian  Past Medical History-  Patient Active Problem List   Diagnosis Date Noted  . Hypertension 11/13/2010    Priority: Medium  . Sleep apnea 01/07/2009    Priority: Medium  . Hyperlipidemia 01/08/2008    Priority: Medium  . Migraine 12/04/2007    Priority: Medium  . Former smoker 06/03/2015    Priority: Low  . Allergic rhinitis 12/24/2014    Priority: Low  . GERD (gastroesophageal reflux disease) 11/14/1999    Priority: Low  . Quadriceps tendinitis 11/17/2015    Medications- reviewed and updated Current Outpatient Prescriptions  Medication Sig Dispense Refill  . atorvastatin (LIPITOR) 10 MG tablet Take 1 tablet (10 mg total) by mouth daily. 90 tablet 3  . Diclofenac Sodium 2 % SOLN Apply fingertip amount to affected area twice daily 1 Bottle 2  . lisinopril-hydrochlorothiazide (PRINZIDE,ZESTORETIC) 20-25 MG per tablet Take 1 tablet by mouth daily. 90 tablet 3  . loratadine (CLARITIN) 10 MG tablet Take 10 mg by mouth daily.    Marland Kitchen omeprazole (PRILOSEC) 20 MG capsule Take 1 capsule (20 mg total) by mouth daily. 90 capsule 3   No current facility-administered medications for this visit.    Objective: BP 124/74 mmHg  Temp(Src) 97.9 F (36.6 C)  Wt 259 lb (117.482 kg) Gen: NAD, resting comfortably CV: RRR no murmurs rubs or gallops Lungs: CTAB no crackles, wheeze, rhonchi Abdomen: soft/nontender/nondistended/normal bowel sounds..  Ext: no edema Skin: warm, dry Neuro: grossly normal, moves all extremities  Assessment/Plan:  Hypertension S: controlled. On  Lisinopril hctz 20-25mg  BP Readings from Last 3 Encounters:  12/10/15 124/74  12/10/15 126/84  11/17/15 128/84  A/P:Continue current meds:   Continue weight loss efforts- down 1 lb over holidays- plan for 240 by next physical   Hyperlipidemia S: poorly controlled on supposedly 4x a week statin but has struggled to maintain this. No myalgias.  Lab Results  Component Value Date   CHOL 198 05/27/2015   HDL 38.60* 05/27/2015   LDLCALC 133* 05/27/2015   LDLDIRECT 141.8 04/28/2013   TRIG 131.0 05/27/2015   CHOLHDL 5 05/27/2015   A/P: continue to work on weight loss efforts, may need to push for daily statin if cannot get weight and LDL down    Return in about 6 months (around 06/08/2016) for physical. with labs including HIV a week before (ask lab to add). Return precautions advised.

## 2015-12-10 NOTE — Assessment & Plan Note (Signed)
S: poorly controlled on supposedly 4x a week statin but has struggled to maintain this. No myalgias.  Lab Results  Component Value Date   CHOL 198 05/27/2015   HDL 38.60* 05/27/2015   LDLCALC 133* 05/27/2015   LDLDIRECT 141.8 04/28/2013   TRIG 131.0 05/27/2015   CHOLHDL 5 05/27/2015   A/P: continue to work on weight loss efforts, may need to push for daily statin if cannot get weight and LDL down

## 2015-12-10 NOTE — Assessment & Plan Note (Signed)
Asian is making progress at this time. We discussed icing regimen, topical anti-inflammatories we discussed vitamin D to help with proprioception of some of the calcium. We discussed the possibility of other treatment options if necessary. Patient will try patellar strap to change the fulcrum of the patella itself. Patient and will come back and see me again in 6 weeks for further evaluation.  Spent  25 minutes with patient face-to-face and had greater than 50% of counseling including as described above in assessment and plan.

## 2015-12-10 NOTE — Progress Notes (Signed)
[facility] [address], [place] [address] Phone: [phone] Subjective:    I'm seeing this patient by the request  of:  Garret Reddish, MD   CC: Left knee pain follow-up  [id number] Trevor Flowers is a 50 y.o. male coming in with complaint of left knee pain. Patient was found to have severe quadriceps tendinitis. Patient was to do bracing, home exercises, icing protocol. Patient was given topical anti-inflammatories that he has been using occasionally. Notices approximately a 70% improvement. Still some mild discomfort if he squats too far. Otherwise no significant pain with his regular daily activities.     Past Medical History  Diagnosis Date  . HYPERLIPIDEMIA 01/08/2008  . Migraine 12/04/2007  . TRANSAMINASES, SERUM, ELEVATED 01/08/2008    resolved on repeat  . Hypertension    Past Surgical History  Procedure Laterality Date  . None     Social History   Social History  . Marital Status: Single    Spouse Name: N/A  . Number of Children: N/A  . Years of Education: N/A   Social History Main Topics  . Smoking status: Former Smoker -- 1.00 packs/day for 22 years    Types: Cigarettes    Quit date: 08/22/2007  . Smokeless tobacco: None  . Alcohol Use: 6.0 oz/week    10 Cans of beer per week  . Drug Use: None  . Sexual Activity: Not Asked   Other Topics Concern  . None   Social History Narrative   Married (wife patient here). Son and daughter 51 and 39 in 2016.       Works as Market researcher as Marketing executive      Hobbies: 4 wheeler, live in country in [place]   No Known Allergies Family History  Problem Relation Age of Onset  . Hypertension Mother   . Other Father     unknown history  . Asthma Sister     Past medical history, social, surgical and family history all reviewed in electronic medical record.  No pertanent information unless stated regarding to the chief complaint.    Review of Systems: No headache, visual changes, nausea, vomiting, diarrhea, constipation, dizziness, abdominal pain, skin rash, fevers, chills, night sweats, weight loss, swollen lymph nodes, body aches, joint swelling, muscle aches, chest pain, shortness of breath, mood changes.   Objective Blood pressure 126/84, pulse 73, height 6\' 4"  (1.93 m), weight 256 lb (116.121 kg), SpO2 96 %.  General: No apparent distress alert and oriented x3 mood and affect normal, dressed appropriately.  HEENT: Pupils equal, extraocular movements intact  Respiratory: Patient's speak in full sentences and does not appear short of breath  Cardiovascular: No lower extremity edema, non tender, no erythema  Skin: Warm dry intact with no signs of infection or rash on extremities or on axial skeleton.  Abdomen: Soft nontender  Neuro: Cranial nerves II through XII are intact, neurovascularly intact in all extremities with 2+ DTRs and 2+ pulses.  Lymph: No lymphadenopathy of posterior or anterior cervical chain or axillae bilaterally.  Gait normal with good balance and coordination.  MSK:  Non tender with full range of motion and good stability and symmetric strength and tone of shoulders, elbows, wrist, hip, and ankles bilaterally.  Knee: Left  Normal to inspection with no erythema or effusion or obvious bony abnormalities. Nontender over the distal quadriceps.  ROM full in flexion and extension and lower leg rotation. Ligaments with solid consistent endpoints including ACL,  PCL, LCL, MCL.  Negative Mcmurray's, Apley's, and Thessalonian tests. Mild painful patellar compression. No crepitus noted Patellar and quadriceps tendons unremarkable. Hamstring and quadriceps strength is normal.  Contralateral knee unremarkable   MSK US performed of: Left knee  This study was ordered, performed, and interpreted by Charlann Boxer D.O.  Knee: All structures visualized. Anteromedial, anterolateral, posteromedial, and  posterolateral menisci unremarkable without tearing, fraying, effusion, or displacement. Patellar Tendon unremarkable on long and transverse views without effusion. Quadriceps tendon no longer has a hypoechoic changes. Patient still has the spurring noted though. No tear appreciated..   No abnormality of prepatellar bursa. LCL and MCL unremarkable on long and transverse views. No abnormality of origin of medial or lateral head of the gastrocnemius.  IMPRESSION:  Resolving tendinitis but continued bone spur on the anterior superior surface of the patella  .      Impression and Recommendations:     This case required medical decision making of moderate complexity.      Note: This dictation was prepared with Dragon dictation along with smaller phrase technology. Any transcriptional errors that result from this process are unintentional.

## 2015-12-10 NOTE — Patient Instructions (Addendum)
Great job losing a pound through the holidays. Weight 259 on our scales. You set a goal of 240 by follow up for physical.   Blood pressure looks great.   No changes in medicine. Hope the knee gets better

## 2015-12-10 NOTE — Progress Notes (Signed)
Pre visit review using our clinic review tool, if applicable. No additional management support is needed unless otherwise documented below in the visit note. 

## 2016-01-21 ENCOUNTER — Ambulatory Visit (INDEPENDENT_AMBULATORY_CARE_PROVIDER_SITE_OTHER)
Admission: RE | Admit: 2016-01-21 | Discharge: 2016-01-21 | Disposition: A | Discharge status: Home / Self Care | Payer: BLUE CROSS/BLUE SHIELD | Source: Ambulatory Visit | Attending: Family Medicine | Admitting: Family Medicine

## 2016-01-21 ENCOUNTER — Other Ambulatory Visit (INDEPENDENT_AMBULATORY_CARE_PROVIDER_SITE_OTHER): Payer: BLUE CROSS/BLUE SHIELD

## 2016-01-21 ENCOUNTER — Ambulatory Visit (INDEPENDENT_AMBULATORY_CARE_PROVIDER_SITE_OTHER): Payer: BLUE CROSS/BLUE SHIELD | Admitting: Family Medicine

## 2016-01-21 ENCOUNTER — Encounter: Payer: Self-pay | Admitting: Family Medicine

## 2016-01-21 VITALS — BP 120/82 | HR 64 | Wt 252.0 lb

## 2016-01-21 DIAGNOSIS — M25562 Pain in left knee: Secondary | ICD-10-CM

## 2016-01-21 DIAGNOSIS — M769 Unspecified enthesopathy, lower limb, excluding foot: Secondary | ICD-10-CM

## 2016-01-21 DIAGNOSIS — M76899 Other specified enthesopathies of unspecified lower limb, excluding foot: Secondary | ICD-10-CM

## 2016-01-21 MED ORDER — MELOXICAM 15 MG PO TABS: 15.0000 mg | ORAL_TABLET | Freq: Every day | ORAL | Status: DC

## 2016-01-21 NOTE — Progress Notes (Signed)
[facility] [address] [place], [place] [address] Phone: [phone] Subjective:    I'm seeing this patient by the request  of:  Garret Reddish, MD   CC: Left knee pain follow-up  [id number] Trevor Flowers is a 50 y.o. male coming in with complaint of left knee pain. Patient was found to have severe quadriceps tendinitis.Patient was found to continue to have an anterior spur on the superior aspect of the patella. Patient was doing better at follow-up at states that it seems be worsening again. Unable to squat down severely. Continues to have the pain only on the anterior aspect of the knee and denies any giving out on him. States though that the pain can be bad enough that he can keep him up at night. Looking to do something more aggressive at this time.     Past Medical History  Diagnosis Date  . HYPERLIPIDEMIA 01/08/2008  . Migraine 12/04/2007  . TRANSAMINASES, SERUM, ELEVATED 01/08/2008    resolved on repeat  . Hypertension    Past Surgical History  Procedure Laterality Date  . None     Social History   Social History  . Marital Status: Single    Spouse Name: N/A  . Number of Children: N/A  . Years of Education: N/A   Social History Main Topics  . Smoking status: Former Smoker -- 1.00 packs/day for 22 years    Types: Cigarettes    Quit date: 08/22/2007  . Smokeless tobacco: None  . Alcohol Use: 6.0 oz/week    10 Cans of beer per week  . Drug Use: None  . Sexual Activity: Not Asked   Other Topics Concern  . None   Social History Narrative   Married (wife patient here). Son and daughter 48 and 19 in 2016.       Works as Market researcher as Marketing executive      Hobbies: 4 wheeler, live in country in [place]   No Known Allergies Family History  Problem Relation Age of Onset  . Hypertension Mother   . Other Father     unknown history  . Asthma Sister     Past medical history, social,  surgical and family history all reviewed in electronic medical record.  No pertanent information unless stated regarding to the chief complaint.   Review of Systems: No headache, visual changes, nausea, vomiting, diarrhea, constipation, dizziness, abdominal pain, skin rash, fevers, chills, night sweats, weight loss, swollen lymph nodes, body aches, joint swelling, muscle aches, chest pain, shortness of breath, mood changes.   Objective Blood pressure 120/82, pulse 64, weight 252 lb (114.306 kg), SpO2 97 %.  General: No apparent distress alert and oriented x3 mood and affect normal, dressed appropriately.  HEENT: Pupils equal, extraocular movements intact  Respiratory: Patient's speak in full sentences and does not appear short of breath  Cardiovascular: No lower extremity edema, non tender, no erythema  Skin: Warm dry intact with no signs of infection or rash on extremities or on axial skeleton.  Abdomen: Soft nontender  Neuro: Cranial nerves II through XII are intact, neurovascularly intact in all extremities with 2+ DTRs and 2+ pulses.  Lymph: No lymphadenopathy of posterior or anterior cervical chain or axillae bilaterally.  Gait normal with good balance and coordination.  MSK:  Non tender with full range of motion and good stability and symmetric strength and tone of shoulders, elbows, wrist, hip, and ankles bilaterally.  Knee: Left  Normal to  inspection with no erythema or effusion or obvious bony abnormalities. Mild increasing tenderness over the superior patella. ROM full in flexion and extension and lower leg rotation. Ligaments with solid consistent endpoints including ACL, PCL, LCL, MCL.  Negative Mcmurray's, Apley's, and Thessalonian tests. Mild painful patellar compression. No crepitus noted Patellar and quadriceps tendons unremarkable. Hamstring and quadriceps strength is normal.  Contralateral knee unremarkable   MSK US performed of: Left knee  This study was ordered,  performed, and interpreted by Charlann Boxer D.O.  Knee: All structures visualized. Anteromedial, anterolateral, posteromedial, and posterolateral menisci unremarkable without tearing, fraying, effusion, or displacement. Patellar Tendon unremarkable on long and transverse views without effusion. Superior aspect of the patellar still tender to palpation. Bony abnormality still noted. Consistent with spur.  No abnormality of prepatellar bursa. LCL and MCL unremarkable on long and transverse views. No abnormality of origin of medial or lateral head of the gastrocnemius.  IMPRESSION:  Continued superior surface patellar spur  . Procedure: Real-time Ultrasound Guided Injection of left patella spur injection Device: GE Logiq E  Ultrasound guided injection is preferred based studies that show increased duration, increased effect, greater accuracy, decreased procedural pain, increased response rate, and decreased cost with ultrasound guided versus blind injection.  Verbal informed consent obtained.  Time-out conducted.  Noted no overlying erythema, induration, or other signs of local infection.  Skin prepped in a sterile fashion.  Local anesthesia: Topical Ethyl chloride.  With sterile technique and under real time ultrasound guidance:  With a 21-gauge 1 inch no patient was injected with a total of 2 mL of 0.5% Marcaine and 1 mL of Kenalog 41 g/dL. Patient then had osteotomy of the spur. Tolerated procedure very well. Completed without difficulty  Pain immediately resolved suggesting accurate placement of the medication.  Advised to call if fevers/chills, erythema, induration, drainage, or persistent bleeding.  Images permanently stored and available for review in the ultrasound unit.  Impression: Technically successful ultrasound guided injection.     Impression and Recommendations:     This case required medical decision making of moderate complexity.      Note: This dictation was  prepared with Dragon dictation along with smaller phrase technology. Any transcriptional errors that result from this process are unintentional.

## 2016-01-21 NOTE — Patient Instructions (Addendum)
Good to see you  Lets get an xray today  Ice is your f riend especially in 6 hours.  This week no lifting greater than 20# when sqatting Continue the vitamins  meloxicam daily for next 10 days then as needed See me again in 2-3 weeks and hopefully you are doing well.

## 2016-01-21 NOTE — Assessment & Plan Note (Signed)
Patient will do Coralyn Mark but continued to have the superior spur on the patella. Patient did have injection today and did have the excessive calcium broken up on ultrasound guidance. Patient tolerated the procedure well. We discussed icing regimen. We discussed avoiding anything such as home exercises any heavy lifting, or any squatting. Patient will do no jumping. Patient will come back and see me again in 2 weeks. Prescription for anti-inflammatory given to take daily for the next 10 days. We discussed icing. Patient will come back and see me again in 2-3 weeks. We will ultrasound at that time for healing is appropriately. X-rays pending. Worse symptoms we may consider surgical intervention.

## 2016-02-03 ENCOUNTER — Encounter: Payer: Self-pay | Admitting: Family Medicine

## 2016-02-03 ENCOUNTER — Encounter: Payer: Self-pay | Admitting: *Deleted

## 2016-02-03 ENCOUNTER — Ambulatory Visit (INDEPENDENT_AMBULATORY_CARE_PROVIDER_SITE_OTHER): Payer: BLUE CROSS/BLUE SHIELD | Admitting: Family Medicine

## 2016-02-03 VITALS — BP 114/76 | HR 80 | Ht 76.0 in | Wt 252.0 lb

## 2016-02-03 DIAGNOSIS — M76899 Other specified enthesopathies of unspecified lower limb, excluding foot: Secondary | ICD-10-CM

## 2016-02-03 DIAGNOSIS — M769 Unspecified enthesopathy, lower limb, excluding foot: Secondary | ICD-10-CM

## 2016-02-03 NOTE — Progress Notes (Signed)
[facility] [place], [place] [address] Phone: [phone] Subjective:    I'm seeing this patient by the request  of:  Garret Reddish, MD   CC: Left knee pain follow-up  [id number] Trevor Flowers is a 50 y.o. male coming in with complaint of left knee pain. Patient was found to have severe quadriceps tendinitis. Patient did have injection at last follow-up. Patient states that he is feeling 70% better. Patient states that overall has made significant improvement. Patient states that it is not hurting him with his daily activities as much.     Past Medical History  Diagnosis Date  . HYPERLIPIDEMIA 01/08/2008  . Migraine 12/04/2007  . TRANSAMINASES, SERUM, ELEVATED 01/08/2008    resolved on repeat  . Hypertension    Past Surgical History  Procedure Laterality Date  . None     Social History   Social History  . Marital Status: Single    Spouse Name: N/A  . Number of Children: N/A  . Years of Education: N/A   Social History Main Topics  . Smoking status: Former Smoker -- 1.00 packs/day for 22 years    Types: Cigarettes    Quit date: 08/22/2007  . Smokeless tobacco: None  . Alcohol Use: 6.0 oz/week    10 Cans of beer per week  . Drug Use: None  . Sexual Activity: Not Asked   Other Topics Concern  . None   Social History Narrative   Married (wife patient here). Son and daughter 74 and 56 in 2016.       Works as Market researcher as Marketing executive      Hobbies: 4 wheeler, live in country in [place]   No Known Allergies Family History  Problem Relation Age of Onset  . Hypertension Mother   . Other Father     unknown history  . Asthma Sister     Past medical history, social, surgical and family history all reviewed in electronic medical record.  No pertanent information unless stated regarding to the chief complaint.   Review of Systems: No headache, visual changes, nausea, vomiting,  diarrhea, constipation, dizziness, abdominal pain, skin rash, fevers, chills, night sweats, weight loss, swollen lymph nodes, body aches, joint swelling, muscle aches, chest pain, shortness of breath, mood changes.   Objective Blood pressure 114/76, pulse 80, height 6\' 4"  (1.93 m), weight 252 lb (114.306 kg), SpO2 96 %.  General: No apparent distress alert and oriented x3 mood and affect normal, dressed appropriately.  HEENT: Pupils equal, extraocular movements intact  Respiratory: Patient's speak in full sentences and does not appear short of breath  Cardiovascular: No lower extremity edema, non tender, no erythema  Skin: Warm dry intact with no signs of infection or rash on extremities or on axial skeleton.  Abdomen: Soft nontender  Neuro: Cranial nerves II through XII are intact, neurovascularly intact in all extremities with 2+ DTRs and 2+ pulses.  Lymph: No lymphadenopathy of posterior or anterior cervical chain or axillae bilaterally.  Gait normal with good balance and coordination.  MSK:  Non tender with full range of motion and good stability and symmetric strength and tone of shoulders, elbows, wrist, hip, and ankles bilaterally.  Knee: Left  Normal to inspection with no erythema or effusion or obvious bony abnormalities. Minimal tenderness over the superior patella ROM full in flexion and extension and lower leg rotation. Ligaments with solid consistent endpoints including ACL, PCL, LCL, MCL.  Negative Mcmurray's,  Apley's, and Thessalonian tests. Less painful over the patella and previous exam No crepitus noted Patellar and quadriceps tendons unremarkable. Hamstring and quadriceps strength is normal.  Contralateral knee unremarkable Improving from previous exam  MSK US performed of: Left knee  This study was ordered, performed, and interpreted by Charlann Boxer D.O.  Knee: All structures visualized. Anteromedial, anterolateral, posteromedial, and posterolateral menisci  unremarkable without tearing, fraying, effusion, or displacement. Patellar Tendon shows mild hypoechoic changes the patient's tendon is intact. Patient's spur that was seen previously has had significant breakdown. Approximately 50% smaller in size. Patient does have some mild calcific bodies that are likely being reabsorbed..  No abnormality of prepatellar bursa. LCL and MCL unremarkable on long and transverse views. No abnormality of origin of medial or lateral head of the gastrocnemius.  IMPRESSION:  Improving patellar spur      Impression and Recommendations:     This case required medical decision making of moderate complexity.      Note: This dictation was prepared with Dragon dictation along with smaller phrase technology. Any transcriptional errors that result from this process are unintentional.

## 2016-02-03 NOTE — Patient Instructions (Signed)
Good to see you  Ice is your friend OK to do low resistance with bike and with elliptical for now.  Keep doing the small squats.  Vitamin D 2000 IU daily is enough  Good luck with soccer See me again in 3-6 weeks just hopefully one more time to make sure we are almost all the way there.

## 2016-02-03 NOTE — Progress Notes (Signed)
Pre visit review using our clinic review tool, if applicable. No additional management support is needed unless otherwise documented below in the visit note. 

## 2016-02-03 NOTE — Assessment & Plan Note (Signed)
Making progress. Exercise prescription is doing better. Patient is going to start increasing activity. Patient can continue the other medications he is having previously. Patient come back and see me again in 6 weeks and we will do an ultrasound at that time to make sure that healing is appropriate.

## 2016-03-10 ENCOUNTER — Ambulatory Visit (INDEPENDENT_AMBULATORY_CARE_PROVIDER_SITE_OTHER): Payer: BLUE CROSS/BLUE SHIELD | Admitting: Family Medicine

## 2016-03-10 ENCOUNTER — Encounter: Payer: Self-pay | Admitting: Family Medicine

## 2016-03-10 VITALS — BP 122/78 | HR 82 | Wt 250.0 lb

## 2016-03-10 DIAGNOSIS — M76899 Other specified enthesopathies of unspecified lower limb, excluding foot: Secondary | ICD-10-CM

## 2016-03-10 DIAGNOSIS — M769 Unspecified enthesopathy, lower limb, excluding foot: Secondary | ICD-10-CM | POA: Diagnosis not present

## 2016-03-10 NOTE — Assessment & Plan Note (Signed)
Patient seems to be doing significantly better at this time. I would make any changes in management. Follow-up as needed.

## 2016-03-10 NOTE — Progress Notes (Signed)
[facility] [place], [place] [address] Phone: [phone] Subjective:    I'm seeing this patient by the request  of:  Garret Reddish, MD   CC: Left knee pain follow-up  [id number] Trevor Flowers is a 50 y.o. male coming in with complaint of left knee pain. Patient was found to have severe quadriceps tendinitis. Patient was responding very well to the injection previously. Patient was to start increasing his activity slowly. It has been one month since his previous follow-up. Patient was 70% better previously. Now he states he is doing 95% better. Some very mild discomfort from time to time when he does too much activity. Still painful. Puts pressure on that area. Tries to avoid a. No longer having any of the swelling. Very happy with the results.     Past Medical History  Diagnosis Date  . HYPERLIPIDEMIA 01/08/2008  . Migraine 12/04/2007  . TRANSAMINASES, SERUM, ELEVATED 01/08/2008    resolved on repeat  . Hypertension    Past Surgical History  Procedure Laterality Date  . None     Social History   Social History  . Marital Status: Single    Spouse Name: N/A  . Number of Children: N/A  . Years of Education: N/A   Social History Main Topics  . Smoking status: Former Smoker -- 1.00 packs/day for 22 years    Types: Cigarettes    Quit date: 08/22/2007  . Smokeless tobacco: None  . Alcohol Use: 6.0 oz/week    10 Cans of beer per week  . Drug Use: None  . Sexual Activity: Not Asked   Other Topics Concern  . None   Social History Narrative   Married (wife patient here). Son and daughter 87 and 63 in 2016.       Works as Market researcher as Marketing executive      Hobbies: 4 wheeler, live in country in [place]   No Known Allergies Family History  Problem Relation Age of Onset  . Hypertension Mother   . Other Father     unknown history  . Asthma Sister     Past medical history, social,  surgical and family history all reviewed in electronic medical record.  No pertanent information unless stated regarding to the chief complaint.   Review of Systems: No headache, visual changes, nausea, vomiting, diarrhea, constipation, dizziness, abdominal pain, skin rash, fevers, chills, night sweats, weight loss, swollen lymph nodes, body aches, joint swelling, muscle aches, chest pain, shortness of breath, mood changes.   Objective Blood pressure 122/78, pulse 82, weight 250 lb (113.399 kg).  General: No apparent distress alert and oriented x3 mood and affect normal, dressed appropriately.  HEENT: Pupils equal, extraocular movements intact  Respiratory: Patient's speak in full sentences and does not appear short of breath  Cardiovascular: No lower extremity edema, non tender, no erythema  Skin: Warm dry intact with no signs of infection or rash on extremities or on axial skeleton.  Abdomen: Soft nontender  Neuro: Cranial nerves II through XII are intact, neurovascularly intact in all extremities with 2+ DTRs and 2+ pulses.  Lymph: No lymphadenopathy of posterior or anterior cervical chain or axillae bilaterally.  Gait normal with good balance and coordination.  MSK:  Non tender with full range of motion and good stability and symmetric strength and tone of shoulders, elbows, wrist, hip, and ankles bilaterally.  Knee: Left  Normal to inspection with no erythema or effusion or obvious  bony abnormalities. No tenderness today .  ROM full in flexion and extension and lower leg rotation. Ligaments with solid consistent endpoints including ACL, PCL, LCL, MCL.  Negative Mcmurray's, Apley's, and Thessalonian tests. No crepitus noted Patellar and quadriceps tendons unremarkable. Hamstring and quadriceps strength is normal.  Contralateral knee unremarkable Improving from previous exam    Impression and Recommendations:     This case required medical decision making of moderate  complexity.      Note: This dictation was prepared with Dragon dictation along with smaller phrase technology. Any transcriptional errors that result from this process are unintentional.

## 2016-03-30 DIAGNOSIS — G43001 Migraine without aura, not intractable, with status migrainosus: Secondary | ICD-10-CM | POA: Diagnosis not present

## 2016-03-30 DIAGNOSIS — G44219 Episodic tension-type headache, not intractable: Secondary | ICD-10-CM | POA: Diagnosis not present

## 2016-06-01 ENCOUNTER — Other Ambulatory Visit (INDEPENDENT_AMBULATORY_CARE_PROVIDER_SITE_OTHER): Payer: BLUE CROSS/BLUE SHIELD

## 2016-06-01 DIAGNOSIS — Z0000 Encounter for general adult medical examination without abnormal findings: Secondary | ICD-10-CM

## 2016-06-01 LAB — POC URINALSYSI DIPSTICK (AUTOMATED)
Bilirubin, UA: NEGATIVE
Glucose, UA: NEGATIVE
Ketones, UA: NEGATIVE
Leukocytes, UA: NEGATIVE
Nitrite, UA: NEGATIVE
Spec Grav, UA: >=1.03
Urobilinogen, UA: 1
pH, UA: 5.5

## 2016-06-01 LAB — HEPATIC FUNCTION PANEL
ALT: 35 U/L (ref 0–53)
AST: 30 U/L (ref 0–37)
Albumin: 4.4 g/dL (ref 3.5–5.2)
Alkaline Phosphatase: 48 U/L (ref 39–117)
Bilirubin, Direct: 0.2 mg/dL (ref 0.0–0.3)
Total Bilirubin: 1 mg/dL (ref 0.2–1.2)
Total Protein: 7.7 g/dL (ref 6.0–8.3)

## 2016-06-01 LAB — LIPID PANEL
Cholesterol: 222 mg/dL — ABNORMAL HIGH (ref 0–200)
HDL: 43.2 mg/dL (ref >39.00)
LDL Cholesterol: 158 mg/dL — ABNORMAL HIGH (ref 0–99)
NonHDL: 179.28
Total CHOL/HDL Ratio: 5
Triglycerides: 108 mg/dL (ref 0.0–149.0)
VLDL: 21.6 mg/dL (ref 0.0–40.0)

## 2016-06-01 LAB — CBC WITH DIFFERENTIAL/PLATELET
Basophils Absolute: 0 10*3/uL (ref 0.0–0.1)
Basophils Relative: 0.5 % (ref 0.0–3.0)
Eosinophils Absolute: 0.2 10*3/uL (ref 0.0–0.7)
Eosinophils Relative: 3.2 % (ref 0.0–5.0)
HCT: 43.8 % (ref 39.0–52.0)
Hemoglobin: 14.8 g/dL (ref 13.0–17.0)
Lymphocytes Relative: 36.2 % (ref 12.0–46.0)
Lymphs Abs: 2.8 10*3/uL (ref 0.7–4.0)
MCHC: 33.8 g/dL (ref 30.0–36.0)
MCV: 88.2 fl (ref 78.0–100.0)
Monocytes Absolute: 0.7 10*3/uL (ref 0.1–1.0)
Monocytes Relative: 8.9 % (ref 3.0–12.0)
Neutro Abs: 3.9 10*3/uL (ref 1.4–7.7)
Neutrophils Relative %: 51.2 % (ref 43.0–77.0)
Platelets: 353 10*3/uL (ref 150.0–400.0)
RBC: 4.97 Mil/uL (ref 4.22–5.81)
RDW: 14.2 % (ref 11.5–15.5)
WBC: 7.7 10*3/uL (ref 4.0–10.5)

## 2016-06-01 LAB — BASIC METABOLIC PANEL
BUN: 14 mg/dL (ref 6–23)
CO2: 27 mEq/L (ref 19–32)
Calcium: 9.7 mg/dL (ref 8.4–10.5)
Chloride: 100 mEq/L (ref 96–112)
Creatinine, Ser: 1.2 mg/dL (ref 0.40–1.50)
GFR: 82.93 mL/min (ref >60.00)
Glucose, Bld: 104 mg/dL — ABNORMAL HIGH (ref 70–99)
Potassium: 3.8 mEq/L (ref 3.5–5.1)
Sodium: 135 mEq/L (ref 135–145)

## 2016-06-01 LAB — PSA: PSA: 0.99 ng/mL (ref 0.10–4.00)

## 2016-06-01 LAB — TSH: TSH: 1.13 u[IU]/mL (ref 0.35–4.50)

## 2016-06-09 ENCOUNTER — Encounter: Payer: Self-pay | Admitting: Family Medicine

## 2016-06-09 ENCOUNTER — Ambulatory Visit (INDEPENDENT_AMBULATORY_CARE_PROVIDER_SITE_OTHER): Payer: BLUE CROSS/BLUE SHIELD | Admitting: Family Medicine

## 2016-06-09 VITALS — BP 118/74 | HR 87 | Temp 98.0°F | Ht 76.75 in | Wt 244.6 lb

## 2016-06-09 DIAGNOSIS — E785 Hyperlipidemia, unspecified: Secondary | ICD-10-CM | POA: Diagnosis not present

## 2016-06-09 DIAGNOSIS — K219 Gastro-esophageal reflux disease without esophagitis: Secondary | ICD-10-CM | POA: Diagnosis not present

## 2016-06-09 DIAGNOSIS — R319 Hematuria, unspecified: Secondary | ICD-10-CM

## 2016-06-09 DIAGNOSIS — E119 Type 2 diabetes mellitus without complications: Secondary | ICD-10-CM | POA: Insufficient documentation

## 2016-06-09 DIAGNOSIS — R739 Hyperglycemia, unspecified: Secondary | ICD-10-CM | POA: Diagnosis not present

## 2016-06-09 DIAGNOSIS — Z0001 Encounter for general adult medical examination with abnormal findings: Secondary | ICD-10-CM

## 2016-06-09 DIAGNOSIS — R6889 Other general symptoms and signs: Secondary | ICD-10-CM

## 2016-06-09 LAB — URINALYSIS, MICROSCOPIC ONLY: RBC / HPF: NONE SEEN (ref 0–?)

## 2016-06-09 MED ORDER — ATORVASTATIN CALCIUM 40 MG PO TABS: 40.0000 mg | ORAL_TABLET | Freq: Every day | ORAL | 3 refills | Status: DC

## 2016-06-09 MED ORDER — OMEPRAZOLE 40 MG PO CPDR: 40.0000 mg | DELAYED_RELEASE_CAPSULE | Freq: Every day | ORAL | 3 refills | Status: DC

## 2016-06-09 NOTE — Patient Instructions (Addendum)
Repeat urine before you leave  Weight loss, regular exercise, healthy eating advised. You are at increased risk for diabetes  Increase prilosec to 40mg - reach out if not effective, may need to refer to GI for evaluation  Increase atorvastatin to 40mg  and try to take daily but at minimum twice a week

## 2016-06-09 NOTE — Progress Notes (Signed)
Pre visit review using our clinic review tool, if applicable. No additional management support is needed unless otherwise documented below in the visit note. 

## 2016-06-09 NOTE — Progress Notes (Signed)
Phone: [phone]  Subjective:  Patient presents today for their annual physical. Chief complaint-noted.   See problem oriented charting- ROS- full  review of systems was completed and negative except for: occasional itching from hemorrhoids. No chest pain or shortness of breath. No headache or blurry vision. Stress from going through divorce  The following were reviewed and entered/updated in epic: Past Medical History:  Diagnosis Date  . HYPERLIPIDEMIA 01/08/2008  . Hypertension   . Migraine 12/04/2007  . TRANSAMINASES, SERUM, ELEVATED 01/08/2008   resolved on repeat   Patient Active Problem List   Diagnosis Date Noted  . Hypertension 11/13/2010    Priority: Medium  . Sleep apnea 01/07/2009    Priority: Medium  . Hyperlipidemia 01/08/2008    Priority: Medium  . Migraine 12/04/2007    Priority: Medium  . Former smoker 06/03/2015    Priority: Low  . Allergic rhinitis 12/24/2014    Priority: Low  . GERD (gastroesophageal reflux disease) 11/14/1999    Priority: Low  . Hyperglycemia 06/09/2016  . Quadriceps tendinitis 11/17/2015   Past Surgical History:  Procedure Laterality Date  . none      Family History  Problem Relation Age of Onset  . Hypertension Mother   . Other Father     unknown history  . Asthma Sister     Medications- reviewed and updated Current Outpatient Prescriptions  Medication Sig Dispense Refill  . atorvastatin (LIPITOR) 40 MG tablet Take 1 tablet (40 mg total) by mouth daily. 90 tablet 3  . lisinopril-hydrochlorothiazide (PRINZIDE,ZESTORETIC) 20-25 MG per tablet Take 1 tablet by mouth daily. 90 tablet 3  . loratadine (CLARITIN) 10 MG tablet Take 10 mg by mouth daily.    Marland Kitchen omeprazole (PRILOSEC) 40 MG capsule Take 1 capsule (40 mg total) by mouth daily. 90 capsule 3   No current facility-administered medications for this visit.     Allergies-reviewed and updated No Known Allergies  Social History   Social History  . Marital status:  Single    Spouse name: N/A  . Number of children: N/A  . Years of education: N/A   Social History Main Topics  . Smoking status: Former Smoker    Packs/day: 1.00    Years: 22.00    Types: Cigarettes    Quit date: 08/22/2007  . Smokeless tobacco: None  . Alcohol use 6.0 oz/week    10 Cans of beer per week  . Drug use: Unknown  . Sexual activity: Not Asked   Other Topics Concern  . None   Social History Narrative   Married (wife patient here- in process of divorce (states wife wants to do drugs)). Son and daughter 17 and 49 in 2016.       Works as Market researcher as Marketing executive      Hobbies: 4 wheeler, live in country in [place]    Objective: BP 118/74 (BP Location: Left Arm, Patient Position: Sitting, Cuff Size: Large)   Pulse 87   Temp 98 F (36.7 C) (Oral)   Ht 6' 4.75" (1.949 m)   Wt 244 lb 9.6 oz (110.9 kg)   SpO2 95%   BMI 29.19 kg/m  Gen: NAD, resting comfortably HEENT: Mucous membranes are moist. Oropharynx normal Neck: no thyromegaly CV: RRR no murmurs rubs or gallops Lungs: CTAB no crackles, wheeze, rhonchi Abdomen: soft/nontender/nondistended/normal bowel sounds. No rebound or guarding.  Ext: no edema Skin: warm, dry Neuro: grossly normal, moves all extremities, PERRL Rectal: normal tone, normal size prostate, no masses  or tenderness  Assessment/Plan:  50 y.o. male presenting for annual physical.  Health Maintenance counseling: 1. Anticipatory guidance: Patient counseled regarding regular dental exams, eye exams, wearing seatbelts.  2. Risk factor reduction:  Advised patient of need for regular exercise and diet rich and fruits and vegetables to reduce risk of heart attack and stroke. Knows he needs to bump exercise up and eat better but stress of going through divorce has made this tough. Fortunately has lost a few lbs.  Wt Readings from Last 3 Encounters:  06/09/16 244 lb 9.6 oz (110.9 kg)  03/10/16 250 lb (113.4 kg)   02/03/16 252 lb (114.3 kg)  3. Immunizations/screenings/ancillary studies Immunization History  Administered Date(s) Administered  . Td 12/15/2007  . Tdap 01/08/2008   Health Maintenance Due  Topic Date Due  . HIV Screening - next labs  11/29/1982   4. Prostate cancer screening- low risk based on PSA trend and rectal   Lab Results  Component Value Date   PSA 0.99 06/01/2016   PSA 0.87 05/27/2015   PSA 0.88 03/23/2014   5. Colon cancer screening - no family history, start age 84  Status of chronic or acute concerns  Sleep apnea-  Using CPAP  Migraines-  Still seeing Dr. Melton Alar- topamax in past no longer on. Also prior imipramine no longer on. Has been treated with steroids for flares  HTN- controlled  on lisinopril HCT 20-25mg   Allergies- controlled on claritin   Quadriceps tendonitis- healing  Hyperglycemia at risk fod diabetes based on CBG 104, discussed weight loss   Hyperlipidemia poor control likely due to compliance on atorvastatin 10mg , will increase to 40mg  as likely would have increased benefit   GERD (gastroesophageal reflux disease) controlled on prilosec 20mg , more stressful time- will trial 40mg  through next visit as symptoms worsened   Return in about 6 months (around 12/10/2016) for follow up- or sooner if needed.  Orders Placed This Encounter  Procedures  . Urine Microscopic Only    Meds ordered this encounter  Medications  . atorvastatin (LIPITOR) 40 MG tablet    Sig: Take 1 tablet (40 mg total) by mouth daily.    Dispense:  90 tablet    Refill:  3  . omeprazole (PRILOSEC) 40 MG capsule    Sig: Take 1 capsule (40 mg total) by mouth daily.    Dispense:  90 capsule    Refill:  3    Return precautions advised.   Garret Reddish, MD

## 2016-06-09 NOTE — Assessment & Plan Note (Signed)
controlled on prilosec 20mg , more stressful time- will trial 40mg  through next visit as symptoms worsened

## 2016-06-09 NOTE — Assessment & Plan Note (Signed)
poor control likely due to compliance on atorvastatin 10mg , will increase to 40mg  as likely would have increased benefit

## 2016-06-09 NOTE — Assessment & Plan Note (Signed)
at risk fod diabetes based on CBG 104, discussed weight loss

## 2016-06-22 ENCOUNTER — Other Ambulatory Visit: Payer: Self-pay | Admitting: Family Medicine

## 2016-10-31 ENCOUNTER — Other Ambulatory Visit: Payer: Self-pay | Admitting: Family Medicine

## 2016-11-20 DIAGNOSIS — J111 Influenza due to unidentified influenza virus with other respiratory manifestations: Secondary | ICD-10-CM | POA: Diagnosis not present

## 2016-11-20 DIAGNOSIS — J029 Acute pharyngitis, unspecified: Secondary | ICD-10-CM | POA: Diagnosis not present

## 2016-11-20 DIAGNOSIS — J40 Bronchitis, not specified as acute or chronic: Secondary | ICD-10-CM | POA: Diagnosis not present

## 2016-11-20 DIAGNOSIS — J020 Streptococcal pharyngitis: Secondary | ICD-10-CM | POA: Diagnosis not present

## 2016-11-20 DIAGNOSIS — J329 Chronic sinusitis, unspecified: Secondary | ICD-10-CM | POA: Diagnosis not present

## 2017-01-02 DIAGNOSIS — R030 Elevated blood-pressure reading, without diagnosis of hypertension: Secondary | ICD-10-CM | POA: Diagnosis not present

## 2017-01-02 DIAGNOSIS — G43009 Migraine without aura, not intractable, without status migrainosus: Secondary | ICD-10-CM | POA: Diagnosis not present

## 2017-01-02 DIAGNOSIS — G44211 Episodic tension-type headache, intractable: Secondary | ICD-10-CM | POA: Diagnosis not present

## 2017-06-02 ENCOUNTER — Other Ambulatory Visit: Payer: Self-pay | Admitting: Family Medicine

## 2017-06-26 ENCOUNTER — Other Ambulatory Visit: Payer: Self-pay | Admitting: Family Medicine

## 2017-12-25 ENCOUNTER — Encounter: Payer: Self-pay | Admitting: Family Medicine

## 2017-12-25 ENCOUNTER — Telehealth: Payer: Self-pay | Admitting: Family Medicine

## 2017-12-25 ENCOUNTER — Ambulatory Visit (INDEPENDENT_AMBULATORY_CARE_PROVIDER_SITE_OTHER): Payer: BLUE CROSS/BLUE SHIELD | Admitting: Family Medicine

## 2017-12-25 VITALS — BP 120/82 | HR 83 | Temp 98.2°F | Ht 76.75 in | Wt 256.0 lb

## 2017-12-25 DIAGNOSIS — E785 Hyperlipidemia, unspecified: Secondary | ICD-10-CM | POA: Diagnosis not present

## 2017-12-25 DIAGNOSIS — G43109 Migraine with aura, not intractable, without status migrainosus: Secondary | ICD-10-CM | POA: Diagnosis not present

## 2017-12-25 DIAGNOSIS — K219 Gastro-esophageal reflux disease without esophagitis: Secondary | ICD-10-CM | POA: Diagnosis not present

## 2017-12-25 DIAGNOSIS — Z1211 Encounter for screening for malignant neoplasm of colon: Secondary | ICD-10-CM | POA: Diagnosis not present

## 2017-12-25 DIAGNOSIS — R739 Hyperglycemia, unspecified: Secondary | ICD-10-CM

## 2017-12-25 DIAGNOSIS — Z87891 Personal history of nicotine dependence: Secondary | ICD-10-CM | POA: Diagnosis not present

## 2017-12-25 DIAGNOSIS — Z125 Encounter for screening for malignant neoplasm of prostate: Secondary | ICD-10-CM | POA: Diagnosis not present

## 2017-12-25 DIAGNOSIS — Z0000 Encounter for general adult medical examination without abnormal findings: Secondary | ICD-10-CM

## 2017-12-25 DIAGNOSIS — I10 Essential (primary) hypertension: Secondary | ICD-10-CM

## 2017-12-25 MED ORDER — LANSOPRAZOLE 30 MG PO CPDR: 30.0000 mg | DELAYED_RELEASE_CAPSULE | Freq: Every day | ORAL | 3 refills | Status: DC

## 2017-12-25 MED ORDER — FLURBIPROFEN 50 MG PO TABS: 50.0000 mg | ORAL_TABLET | Freq: Two times a day (BID) | ORAL | 3 refills | Status: DC | PRN

## 2017-12-25 NOTE — Assessment & Plan Note (Signed)
Hyperlipidemia-  Poor control when last checked. Now on atorvastatin 40mg - takes at least 4 days a week. Discussed taking this regularly.

## 2017-12-25 NOTE — Patient Instructions (Addendum)
We will call you within a week or two about your referral to GI for colonoscopy. If you do not hear within 3 weeks, give [place] a call.   Schedule a lab visit at the check out desk within 2 weeks (call back for this). Return for future fasting labs meaning nothing but water after midnight please. Ok to take your medications with water.   Lets work on getting weight back down over next 6 months. Would love to see you in 6 months to check in on headaches and weight and blood pressure  Definitely schedule a physical in a year a swell.

## 2017-12-25 NOTE — Telephone Encounter (Signed)
Copied from [place] [id number]. Topic: Quick Communication - Patient Running Late >> Dec 25, 2017  1:56 PM Flowers, Trevor Emperor wrote: Patient called and is running late states he is stuck in traffic and is not sure how long he may be. Adv pt of 10 min late policy   Route to department's PEC pool.

## 2017-12-25 NOTE — Assessment & Plan Note (Signed)
Hypertension on lisinopril hctz 20-25 mg with good control- and takes 5-6 days week.  discussed taking regularly daily not just 5-6 days of week

## 2017-12-25 NOTE — Assessment & Plan Note (Signed)
GERD- on omeprazole 40mg  rx. He has taken lansoprazole in the past at 30mg  which was far more effective- we will change pills due to current breakthrough symptoms at times. Discussed weight loss.

## 2017-12-25 NOTE — Progress Notes (Signed)
Phone: [phone]  Subjective:  Patient presents today for their annual physical. Chief complaint-noted.   See problem oriented charting- ROS- full  review of systems was completed and negative except for: dental issues, hearing loss, joint pain, muscle aches, neck stiffness, sleep and anxiety related to hearing about ex wife not doing well, needs to get eyes checked- has had some issues with smaller words  The following were reviewed and entered/updated in epic: Past Medical History:  Diagnosis Date  . HYPERLIPIDEMIA 01/08/2008  . Hypertension   . Migraine 12/04/2007  . TRANSAMINASES, SERUM, ELEVATED 01/08/2008   resolved on repeat   Patient Active Problem List   Diagnosis Date Noted  . Hypertension 11/13/2010    Priority: Medium  . Sleep apnea 01/07/2009    Priority: Medium  . Hyperlipidemia 01/08/2008    Priority: Medium  . Migraine 12/04/2007    Priority: Medium  . Former smoker 06/03/2015    Priority: Low  . Allergic rhinitis 12/24/2014    Priority: Low  . GERD (gastroesophageal reflux disease) 11/14/1999    Priority: Low  . Hyperglycemia 06/09/2016  . Quadriceps tendinitis 11/17/2015   Past Surgical History:  Procedure Laterality Date  . none      Family History  Problem Relation Age of Onset  . Hypertension Mother   . Other Father        unknown history  . Asthma Sister     Medications- reviewed and updated Current Outpatient Medications  Medication Sig Dispense Refill  . atorvastatin (LIPITOR) 40 MG tablet TAKE 1 TABLET (40 MG TOTAL) BY MOUTH DAILY. 90 tablet 1  . lisinopril-hydrochlorothiazide (PRINZIDE,ZESTORETIC) 20-25 MG tablet TAKE 1 TABLET BY MOUTH DAILY. 90 tablet 1  . loratadine (CLARITIN) 10 MG tablet Take 10 mg by mouth daily.    . flurbiprofen (ANSAID) 50 MG tablet Take 1 tablet (50 mg total) by mouth 2 (two) times daily as needed (migraines). 30 tablet 3  . lansoprazole (PREVACID) 30 MG capsule Take 1 capsule (30 mg total) by mouth daily at  12 noon. 90 capsule 3   No current facility-administered medications for this visit.     Allergies-reviewed and updated No Known Allergies  Social History   Social History Narrative   Married (wife patient here- in process of divorce (states wife wants to do drugs)). Son and daughter 70 and 37 in 2016.       Works as Market researcher as Marketing executive      Hobbies: 4 wheeler, live in country in [place]    Objective: BP 120/82 (BP Location: Left Arm, Patient Position: Sitting, Cuff Size: Large)   Pulse 83   Temp 98.2 F (36.8 C) (Oral)   Ht 6' 4.75" (1.949 m)   Wt 256 lb (116.1 kg)   SpO2 95%   BMI 30.56 kg/m  Gen: NAD, resting comfortably HEENT: Mucous membranes are moist. Oropharynx normal Neck: no thyromegaly CV: RRR no murmurs rubs or gallops Lungs: CTAB no crackles, wheeze, rhonchi Abdomen: soft/nontender/nondistended/normal bowel sounds. No rebound or guarding.  Ext: no edema Skin: warm, dry Neuro: grossly normal, moves all extremities, PERRLA Rectal: normal tone, normal sized prostate, no masses or tenderness  Assessment/Plan:  52 y.o. male presenting for annual physical.  Health Maintenance counseling: 1. Anticipatory guidance: Patient counseled regarding regular dental exams -q6 months, eye exams -needs to see eye doctor- encouraged him to go see, wearing seatbelts.  2. Risk factor reduction:  Advised patient of need for regular exercise and diet rich and  fruits and vegetables to reduce risk of heart attack and stroke. 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 [employer] has incentive for weight loss- knows he needs to increase veggies, less starches/carbs- cooks a lot at home but heavy carb based.  Wt Readings from Last 3 Encounters:  12/25/17 256 lb (116.1 kg)  06/09/16 244 lb 9.6 oz (110.9 kg)  03/10/16 250 lb (113.4 kg)  3. Immunizations/screenings/ancillary  studies Immunization History  Administered Date(s) Administered  . Td 12/15/2007  . Tdap 01/08/2008   4. Prostate cancer screening- low risk rectal exam- will get PSA  Lab Results  Component Value Date   PSA 0.99 06/01/2016   PSA 0.87 05/27/2015   PSA 0.88 03/23/2014   5. Colon cancer screening - refer today for first colonoscopy 6. Former smoker- 22 pack years quit 2008. Too early for AAA screen  Status of chronic or acute concerns   OSA on cpap  Hyperglycemia- has had high CBG in past. Will get a1c   Allergic rhinitis- prn antihistamine   Migraine Migraines- has seen Dr. Melton Alar with headache clinic in the past  But his office is now closed Flurbiprofen prn on as needed basis- 2-3 per month. Sumatriptan - uses as back up perhaps once a month or every other month. Sumatriptan- I agreed to refill this but he is not sure of his dose so he will let us know at time of needing refill  Hyperlipidemia Hyperlipidemia-  Poor control when last checked. Now on atorvastatin 40mg - takes at least 4 days a week. Discussed taking this regularly.   Hypertension Hypertension on lisinopril hctz 20-25 mg with good control- and takes 5-6 days week.  discussed taking regularly daily not just 5-6 days of week  GERD (gastroesophageal reflux disease) GERD- on omeprazole 40mg  rx. He has taken lansoprazole in the past at 30mg  which was far more effective- we will change pills due to current breakthrough symptoms at times. Discussed weight loss.    Future Appointments  Date Time Provider [place]  12/31/2017 10:00 AM [facility]  07/12/2018  2:30 PM Marin Olp, MD [facility]   Return in about 6 months (around 06/24/2018) for follow up- or sooner if needed.  Lab/Order associations: Screen for colon cancer - Plan: Ambulatory referral to Gastroenterology  Essential hypertension - Plan: Comprehensive metabolic panel, Lipid panel, CBC, POCT Urinalysis Dipstick  (Automated)  Hyperlipidemia, unspecified hyperlipidemia type - Plan: Comprehensive metabolic panel, Lipid panel, CBC, POCT Urinalysis Dipstick (Automated)  Former smoker - Plan: POCT Urinalysis Dipstick (Automated)  Hyperglycemia - Plan: Hemoglobin A1c  Screening PSA (prostate specific antigen) - Plan: PSA  Meds ordered this encounter  Medications  . flurbiprofen (ANSAID) 50 MG tablet    Sig: Take 1 tablet (50 mg total) by mouth 2 (two) times daily as needed (migraines).    Dispense:  30 tablet    Refill:  3  . lansoprazole (PREVACID) 30 MG capsule    Sig: Take 1 capsule (30 mg total) by mouth daily at 12 noon.    Dispense:  90 capsule    Refill:  3    Return precautions advised.  Garret Reddish, MD

## 2017-12-25 NOTE — Assessment & Plan Note (Signed)
Migraines- has seen Dr. Melton Alar with headache clinic in the past  But his office is now closed Flurbiprofen prn on as needed basis- 2-3 per month. Sumatriptan - uses as back up perhaps once a month or every other month. Sumatriptan- I agreed to refill this but he is not sure of his dose so he will let us know at time of needing refill

## 2017-12-26 NOTE — Telephone Encounter (Signed)
Patient was seen yesterday.

## 2017-12-31 ENCOUNTER — Other Ambulatory Visit: Payer: BLUE CROSS/BLUE SHIELD

## 2017-12-31 ENCOUNTER — Other Ambulatory Visit (INDEPENDENT_AMBULATORY_CARE_PROVIDER_SITE_OTHER): Payer: BLUE CROSS/BLUE SHIELD

## 2017-12-31 ENCOUNTER — Encounter: Payer: Self-pay | Admitting: Internal Medicine

## 2017-12-31 DIAGNOSIS — E785 Hyperlipidemia, unspecified: Secondary | ICD-10-CM

## 2017-12-31 DIAGNOSIS — R739 Hyperglycemia, unspecified: Secondary | ICD-10-CM | POA: Diagnosis not present

## 2017-12-31 DIAGNOSIS — I10 Essential (primary) hypertension: Secondary | ICD-10-CM

## 2017-12-31 DIAGNOSIS — Z125 Encounter for screening for malignant neoplasm of prostate: Secondary | ICD-10-CM

## 2017-12-31 DIAGNOSIS — Z87891 Personal history of nicotine dependence: Secondary | ICD-10-CM | POA: Diagnosis not present

## 2017-12-31 LAB — COMPREHENSIVE METABOLIC PANEL
ALT: 41 U/L (ref 0–53)
AST: 29 U/L (ref 0–37)
Albumin: 4.4 g/dL (ref 3.5–5.2)
Alkaline Phosphatase: 47 U/L (ref 39–117)
BUN: 12 mg/dL (ref 6–23)
CO2: 26 mEq/L (ref 19–32)
Calcium: 9.2 mg/dL (ref 8.4–10.5)
Chloride: 103 mEq/L (ref 96–112)
Creatinine, Ser: 1.14 mg/dL (ref 0.40–1.50)
GFR: 87.41 mL/min (ref >60.00)
Glucose, Bld: 106 mg/dL — ABNORMAL HIGH (ref 70–99)
Potassium: 4.1 mEq/L (ref 3.5–5.1)
Sodium: 137 mEq/L (ref 135–145)
Total Bilirubin: 0.8 mg/dL (ref 0.2–1.2)
Total Protein: 7.8 g/dL (ref 6.0–8.3)

## 2017-12-31 LAB — LIPID PANEL
Cholesterol: 210 mg/dL — ABNORMAL HIGH (ref 0–200)
HDL: 44.9 mg/dL (ref >39.00)
LDL Cholesterol: 136 mg/dL — ABNORMAL HIGH (ref 0–99)
NonHDL: 164.81
Total CHOL/HDL Ratio: 5
Triglycerides: 143 mg/dL (ref 0.0–149.0)
VLDL: 28.6 mg/dL (ref 0.0–40.0)

## 2017-12-31 LAB — POC URINALSYSI DIPSTICK (AUTOMATED)
Blood, UA: NEGATIVE
Glucose, UA: NEGATIVE
Ketones, UA: NEGATIVE
Leukocytes, UA: NEGATIVE
Nitrite, UA: NEGATIVE
Spec Grav, UA: >=1.03 — AB (ref 1.010–1.025)
Urobilinogen, UA: 1 E.U./dL
pH, UA: 5.5 (ref 5.0–8.0)

## 2017-12-31 LAB — CBC
HCT: 45.1 % (ref 39.0–52.0)
Hemoglobin: 15.2 g/dL (ref 13.0–17.0)
MCHC: 33.8 g/dL (ref 30.0–36.0)
MCV: 89 fl (ref 78.0–100.0)
Platelets: 320 10*3/uL (ref 150.0–400.0)
RBC: 5.07 Mil/uL (ref 4.22–5.81)
RDW: 13.8 % (ref 11.5–15.5)
WBC: 7.9 10*3/uL (ref 4.0–10.5)

## 2017-12-31 LAB — HEMOGLOBIN A1C: Hgb A1c MFr Bld: 7.3 % — ABNORMAL HIGH (ref 4.6–6.5)

## 2017-12-31 LAB — PSA: PSA: 1.17 ng/mL (ref 0.10–4.00)

## 2018-02-28 ENCOUNTER — Encounter: Payer: Self-pay | Admitting: Family Medicine

## 2018-02-28 ENCOUNTER — Other Ambulatory Visit: Payer: Self-pay

## 2018-02-28 ENCOUNTER — Ambulatory Visit (AMBULATORY_SURGERY_CENTER): Payer: Self-pay | Admitting: *Deleted

## 2018-02-28 ENCOUNTER — Encounter: Payer: Self-pay | Admitting: Internal Medicine

## 2018-02-28 VITALS — Ht 76.0 in | Wt 254.0 lb

## 2018-02-28 DIAGNOSIS — Z1211 Encounter for screening for malignant neoplasm of colon: Secondary | ICD-10-CM

## 2018-02-28 MED ORDER — PEG-KCL-NACL-NASULF-NA ASC-C 140 G PO SOLR: 1.0000 | ORAL | 0 refills | Status: DC

## 2018-02-28 MED ORDER — NA SULFATE-K SULFATE-MG SULF 17.5-3.13-1.6 GM/177ML PO SOLN: 1.0000 | Freq: Once | ORAL | 0 refills | Status: AC

## 2018-02-28 NOTE — Progress Notes (Signed)
No egg or soy allergy known to patient  No issues with past sedation with any surgeries  or procedures, no intubation problems  No diet pills per patient No home 02 use per patient  No blood thinners per patient  Pt denies issues with constipation  No A fib or A flutter  EMMI video sent to pt's e mail - pt declined suprep 122$ per [facility] - changed to Plenvu and gave sample  LOT [id number]  Exp 04-2019 as directed

## 2018-03-14 ENCOUNTER — Encounter: Payer: Self-pay | Admitting: Internal Medicine

## 2018-03-14 ENCOUNTER — Ambulatory Visit (AMBULATORY_SURGERY_CENTER): Payer: 59 | Admitting: Internal Medicine

## 2018-03-14 ENCOUNTER — Other Ambulatory Visit: Payer: Self-pay

## 2018-03-14 VITALS — BP 100/59 | HR 98 | Temp 98.0°F | Resp 16 | Ht 76.0 in | Wt 254.0 lb

## 2018-03-14 DIAGNOSIS — Z1211 Encounter for screening for malignant neoplasm of colon: Secondary | ICD-10-CM

## 2018-03-14 DIAGNOSIS — D123 Benign neoplasm of transverse colon: Secondary | ICD-10-CM

## 2018-03-14 DIAGNOSIS — K635 Polyp of colon: Secondary | ICD-10-CM

## 2018-03-14 MED ORDER — SODIUM CHLORIDE 0.9 % IV SOLN: 500.0000 mL | Freq: Once | INTRAVENOUS | Status: DC

## 2018-03-14 NOTE — Progress Notes (Signed)
To PACU, VSS. Report to RN.tb 

## 2018-03-14 NOTE — Op Note (Signed)
[facility] Patient Name: Trevor Flowers Procedure Date: 03/14/2018 11:10 AM MRN: [id number] Endoscopist: Jerene Bears , MD Age: 52 Referring MD:  Date of Birth: 1966/04/17 Gender: Male Account #: [id number] Procedure:                Colonoscopy Indications:              Screening for colorectal malignant neoplasm, This                            is the patient's first colonoscopy Medicines:                Monitored Anesthesia Care Procedure:                Pre-Anesthesia Assessment:                           - Prior to the procedure, a History and Physical                            was performed, and patient medications and                            allergies were reviewed. The patient's tolerance of                            previous anesthesia was also reviewed. The risks                            and benefits of the procedure and the sedation                            options and risks were discussed with the patient.                            All questions were answered, and informed consent                            was obtained. Prior Anticoagulants: The patient has                            taken no previous anticoagulant or antiplatelet                            agents. ASA Grade Assessment: II - A patient with                            mild systemic disease. After reviewing the risks                            and benefits, the patient was deemed in                            satisfactory condition to undergo the procedure.  After obtaining informed consent, the colonoscope                            was passed under direct vision. Throughout the                            procedure, the patient's blood pressure, pulse, and                            oxygen saturations were monitored continuously. The                            Model CF-HQ190L [id number]) scope was introduced                            through the anus and  advanced to the the cecum,                            identified by appendiceal orifice and ileocecal                            valve. The colonoscopy was performed without                            difficulty. The patient tolerated the procedure                            well. The quality of the bowel preparation was                            good. The ileocecal valve, appendiceal orifice, and                            rectum were photographed. Scope In: 11:18:55 AM Scope Out: 11:32:25 AM Scope Withdrawal Time: 0 hours 10 minutes 7 seconds  Total Procedure Duration: 0 hours 13 minutes 30 seconds  Findings:                 The digital rectal exam was normal.                           Two sessile polyps were found in the transverse                            colon. The polyps were 4 to 5 mm in size. These                            polyps were removed with a cold snare. Resection                            and retrieval were complete.                           The exam was otherwise without abnormality on  direct and retroflexion views. Complications:            No immediate complications. Estimated Blood Loss:     Estimated blood loss was minimal. Impression:               - Two 4 to 5 mm polyps in the transverse colon,                            removed with a cold snare. Resected and retrieved.                           - The examination was otherwise normal on direct                            and retroflexion views. Recommendation:           - Patient has a contact number available for                            emergencies. The signs and symptoms of potential                            delayed complications were discussed with the                            patient. Return to normal activities tomorrow.                            Written discharge instructions were provided to the                            patient.                           - Resume  previous diet.                           - Continue present medications.                           - Await pathology results.                           - Repeat colonoscopy is recommended for                            surveillance. The colonoscopy date will be                            determined after pathology results from today's                            exam become available for review. Jerene Bears, MD 03/14/2018 11:35:29 AM This report has been signed electronically.

## 2018-03-14 NOTE — Progress Notes (Signed)
Called to room to assist during endoscopic procedure.  Patient ID and intended procedure confirmed with present staff. Received instructions for my participation in the procedure from the performing physician.  

## 2018-03-14 NOTE — Progress Notes (Signed)
Pt's states no medical or surgical changes since previsit or office visit. 

## 2018-03-14 NOTE — Patient Instructions (Signed)
Thank you for allowing [place] to care for you today!  Handouts given for polyps.  Await pathology results    YOU HAD AN ENDOSCOPIC PROCEDURE TODAY AT [facility]:   Refer to the procedure report that was given to you for any specific questions about what was found during the examination.  If the procedure report does not answer your questions, please call your gastroenterologist to clarify.  If you requested that your care partner not be given the details of your procedure findings, then the procedure report has been included in a sealed envelope for you to review at your convenience later.  YOU SHOULD EXPECT: Some feelings of bloating in the abdomen. Passage of more gas than usual.  Walking can help get rid of the air that was put into your GI tract during the procedure and reduce the bloating. If you had a lower endoscopy (such as a colonoscopy or flexible sigmoidoscopy) you may notice spotting of blood in your stool or on the toilet paper. If you underwent a bowel prep for your procedure, you may not have a normal bowel movement for a few days.  Please Note:  You might notice some irritation and congestion in your nose or some drainage.  This is from the oxygen used during your procedure.  There is no need for concern and it should clear up in a day or so.  SYMPTOMS TO REPORT IMMEDIATELY:   Following lower endoscopy (colonoscopy or flexible sigmoidoscopy):  Excessive amounts of blood in the stool  Significant tenderness or worsening of abdominal pains  Swelling of the abdomen that is new, acute  Fever of 100F or higher    For urgent or emergent issues, a gastroenterologist can be reached at any hour by calling [phone].   DIET:  We do recommend a small meal at first, but then you may proceed to your regular diet.  Drink plenty of fluids but you should avoid alcoholic beverages for 24 hours.  ACTIVITY:  You should plan to take it easy for the rest of today and you  should NOT DRIVE or use heavy machinery until tomorrow (because of the sedation medicines used during the test).    FOLLOW UP: Our staff will call the number listed on your records the next business day following your procedure to check on you and address any questions or concerns that you may have regarding the information given to you following your procedure. If we do not reach you, we will leave a message.  However, if you are feeling well and you are not experiencing any problems, there is no need to return our call.  We will assume that you have returned to your regular daily activities without incident.  If any biopsies were taken you will be contacted by phone or by letter within the next 1-3 weeks.  Please call us at [phone] if you have not heard about the biopsies in 3 weeks.    SIGNATURES/CONFIDENTIALITY: You and/or your care partner have signed paperwork which will be entered into your electronic medical record.  These signatures attest to the fact that that the information above on your After Visit Summary has been reviewed and is understood.  Full responsibility of the confidentiality of this discharge information lies with you and/or your care-partner.

## 2018-03-15 ENCOUNTER — Telehealth: Payer: Self-pay | Admitting: *Deleted

## 2018-03-15 ENCOUNTER — Telehealth: Payer: Self-pay

## 2018-03-15 NOTE — Telephone Encounter (Signed)
Left message on answering machine. 

## 2018-03-15 NOTE — Telephone Encounter (Signed)
  Follow up Call-  Call back number 03/14/2018  Post procedure Call Back phone  # [phone]  Permission to leave phone message Yes  Some recent data might be hidden     No ID on VM.  No message left. Garyn Arlotta/Call-back [facility]

## 2018-03-20 ENCOUNTER — Encounter: Payer: Self-pay | Admitting: Family Medicine

## 2018-03-20 ENCOUNTER — Encounter: Payer: Self-pay | Admitting: Internal Medicine

## 2018-03-20 DIAGNOSIS — Z86010 Personal history of colonic polyps: Secondary | ICD-10-CM | POA: Insufficient documentation

## 2018-04-12 ENCOUNTER — Encounter: Payer: Self-pay | Admitting: Family Medicine

## 2018-04-12 ENCOUNTER — Ambulatory Visit (INDEPENDENT_AMBULATORY_CARE_PROVIDER_SITE_OTHER): Payer: 59 | Admitting: Family Medicine

## 2018-04-12 VITALS — BP 110/82 | HR 81 | Temp 98.0°F | Ht 76.0 in | Wt 252.2 lb

## 2018-04-12 DIAGNOSIS — E119 Type 2 diabetes mellitus without complications: Secondary | ICD-10-CM

## 2018-04-12 DIAGNOSIS — E1165 Type 2 diabetes mellitus with hyperglycemia: Secondary | ICD-10-CM

## 2018-04-12 DIAGNOSIS — Z23 Encounter for immunization: Secondary | ICD-10-CM

## 2018-04-12 DIAGNOSIS — E785 Hyperlipidemia, unspecified: Secondary | ICD-10-CM | POA: Diagnosis not present

## 2018-04-12 DIAGNOSIS — G43109 Migraine with aura, not intractable, without status migrainosus: Secondary | ICD-10-CM

## 2018-04-12 DIAGNOSIS — R739 Hyperglycemia, unspecified: Secondary | ICD-10-CM

## 2018-04-12 DIAGNOSIS — I10 Essential (primary) hypertension: Secondary | ICD-10-CM

## 2018-04-12 LAB — POCT GLYCOSYLATED HEMOGLOBIN (HGB A1C): Hemoglobin A1C: 6.6 % — AB (ref 4.0–5.6)

## 2018-04-12 MED ORDER — ATORVASTATIN CALCIUM 40 MG PO TABS: 40.0000 mg | ORAL_TABLET | Freq: Every day | ORAL | 1 refills | Status: DC

## 2018-04-12 NOTE — Patient Instructions (Addendum)
Health Maintenance Due  Topic Date Due  . HIV Screening - Patient Declined 11/29/1982  . TETANUS/TDAP - Today at office visit 01/07/2018   No changes today. Continue your efforts for healthy eating and regular exercise  You do have diabetes but you do not have to take medicine as long as we can keep your a1c under 7.   Lets check in 4 months from now and recheck an a1c to make sure under 7  We will call you within two weeks about your referral to  optho. If you do not hear within 3 weeks, give [place] a call.

## 2018-04-12 NOTE — Progress Notes (Signed)
Subjective:  Trevor Flowers is a 52 y.o. year old very pleasant male patient who presents for/with See problem oriented charting ROS- No chest pain or shortness of breath. No headache or blurry vision.  Denies hypoglycemia  Past Medical History-  Patient Active Problem List   Diagnosis Date Noted  . Diabetes mellitus type II, controlled ([place]) 06/09/2016    Priority: High  . History of adenomatous polyp of colon 03/20/2018    Priority: Medium  . Hypertension 11/13/2010    Priority: Medium  . Sleep apnea 01/07/2009    Priority: Medium  . Hyperlipidemia 01/08/2008    Priority: Medium  . Migraine 12/04/2007    Priority: Medium  . Former smoker 06/03/2015    Priority: Low  . Allergic rhinitis 12/24/2014    Priority: Low  . GERD (gastroesophageal reflux disease) 11/14/1999    Priority: Low  . Quadriceps tendinitis 11/17/2015   Medications- reviewed and updated Current Outpatient Medications  Medication Sig Dispense Refill  . atorvastatin (LIPITOR) 40 MG tablet Take 1 tablet (40 mg total) by mouth daily. 90 tablet 1  . flurbiprofen (ANSAID) 50 MG tablet Take 1 tablet (50 mg total) by mouth 2 (two) times daily as needed (migraines). 30 tablet 3  . lansoprazole (PREVACID) 30 MG capsule Take 1 capsule (30 mg total) by mouth daily at 12 noon. 90 capsule 3  . lisinopril-hydrochlorothiazide (PRINZIDE,ZESTORETIC) 20-25 MG tablet TAKE 1 TABLET BY MOUTH DAILY. 90 tablet 1  . loratadine (CLARITIN) 10 MG tablet Take 10 mg by mouth daily.     No current facility-administered medications for this visit.     Objective: BP 110/82 (BP Location: Left Arm, Patient Position: Sitting, Cuff Size: Normal)   Pulse 81   Temp 98 F (36.7 C) (Oral)   Ht 6\' 4"  (1.93 m)   Wt 252 lb 3.2 oz (114.4 kg)   SpO2 95%   BMI 30.70 kg/m  Gen: NAD, resting comfortably, tall CV: RRR no murmurs rubs or gallops Lungs: CTAB no crackles, wheeze, rhonchi Abdomen: soft/nontender/overweight Ext: no edema Skin:  warm, dry  Assessment/Plan:  Migraine Migraines (knows with nausea and early HA) - about 1 a week. Sumatriptan helps headache but makes him not think as clearly (he will call if needs refill) - has to be a very long migraine before he takes (he states even a month).  Continue current prescriptions.  Diabetes mellitus type II, controlled ([place]) S: New diagnosis of diabetes with 2 consecutive readings on A1c over 6.5.  Counseled patient on this. CBGs- 141 blood sugar at lunch time through work check.  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  At least 10 k steps a day Down 4 lbs from last visit- has been eating better.  Lab Results  Component Value Date   HGBA1C 6.6 (A) 04/12/2018   HGBA1C 7.3 (H) 12/31/2017    A/P: We discussed option of continuing without medications.  Technically he is diet controlled.  He would like to continue without medicine and recheck in 4 months.  Encouraged him to continue his efforts for healthy eating and regular exercise    Hypertension Remains controlled onLisinopril hctz 20-25mg .  Continue current medication  Hyperlipidemia Poor control with atorvastatin 40 mg.  Does miss some doses.  Continue to work on healthy lifestyle choices.  Consider updating LDL  in the next few visits   Future Appointments  Date Time Provider [place]  07/12/2018  2:30 PM Marin Olp, MD [facility]  to our scheduling team "I asked patient to follow-up in 4 months.  He is scheduled in less than 3 months.  This would be too early to repeat hemoglobin A1c.  Can you help him reschedule to at least September 2 or later?"  Lab/Order associations: Controlled type 2 diabetes mellitus without complication, without long-term current use of insulin ([place]) - Plan: Ambulatory referral to Ophthalmology  Hyperglycemia - Plan: POCT glycosylated hemoglobin (Hb A1C)  Need for prophylactic vaccination with combined diphtheria-tetanus-pertussis (DTP)  vaccine - Plan: Tdap vaccine greater than or equal to 7yo IM  Meds ordered this encounter  Medications  . atorvastatin (LIPITOR) 40 MG tablet    Sig: Take 1 tablet (40 mg total) by mouth daily.    Dispense:  90 tablet    Refill:  1    Return precautions advised.  Garret Reddish, MD

## 2018-04-13 NOTE — Assessment & Plan Note (Signed)
Remains controlled onLisinopril hctz 20-25mg .  Continue current medication

## 2018-04-13 NOTE — Assessment & Plan Note (Addendum)
S: New diagnosis of diabetes with 2 consecutive readings on A1c over 6.5.  Counseled patient on this. CBGs- 141 blood sugar at lunch time through work check.  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  At least 10 k steps a day Down 4 lbs from last visit- has been eating better.  Lab Results  Component Value Date   HGBA1C 6.6 (A) 04/12/2018   HGBA1C 7.3 (H) 12/31/2017    A/P: We discussed option of continuing without medications.  Technically he is diet controlled.  He would like to continue without medicine and recheck in 4 months.  Encouraged him to continue his efforts for healthy eating and regular exercise  We will  refer him to his daughter's ophthalmologist as well to help him establish care for diabetic eye exams yearly

## 2018-04-13 NOTE — Assessment & Plan Note (Signed)
Migraines (knows with nausea and early HA) - about 1 a week. Sumatriptan helps headache but makes him not think as clearly (he will call if needs refill) - has to be a very long migraine before he takes (he states even a month).  Continue current prescriptions.

## 2018-04-13 NOTE — Assessment & Plan Note (Signed)
Poor control with atorvastatin 40 mg.  Does miss some doses.  Continue to work on healthy lifestyle choices.  Consider updating LDL  in the next few visits

## 2018-04-15 NOTE — Progress Notes (Signed)
LVM to reschedule. CRM in place.

## 2018-04-30 ENCOUNTER — Telehealth: Payer: Self-pay | Admitting: Family Medicine

## 2018-04-30 NOTE — Telephone Encounter (Signed)
Copied from [place] [phone]. Topic: Quick Communication - See Telephone Encounter >> Apr 30, 2018 11:54 AM Conception Chancy, NT wrote: CRM for notification. See Telephone encounter for: 04/30/18.  Patient is requesting a refill on atorvastatin (LIPITOR) 40 MG tablet. Patient would like to get a 90 day supply rather than 30. Please advise.  [id number] [place] [place] - [address] [place] [address] Phone: [phone] Fax: [phone]

## 2018-05-01 NOTE — Telephone Encounter (Signed)
Call to the pharmacy- due to patient's insurance- they only cover 30 day supply with copay- patient notified.

## 2018-06-28 ENCOUNTER — Encounter (HOSPITAL_COMMUNITY): Payer: Self-pay | Admitting: Emergency Medicine

## 2018-06-28 ENCOUNTER — Emergency Department (HOSPITAL_COMMUNITY)
Admission: EM | Admit: 2018-06-28 | Discharge: 2018-06-28 | Disposition: A | Discharge status: Home / Self Care | Attending: Emergency Medicine | Admitting: Emergency Medicine

## 2018-06-28 ENCOUNTER — Ambulatory Visit: Payer: Self-pay

## 2018-06-28 ENCOUNTER — Emergency Department (HOSPITAL_COMMUNITY)

## 2018-06-28 ENCOUNTER — Other Ambulatory Visit: Payer: Self-pay

## 2018-06-28 DIAGNOSIS — J705 Respiratory conditions due to smoke inhalation: Secondary | ICD-10-CM

## 2018-06-28 DIAGNOSIS — T59811A Toxic effect of smoke, accidental (unintentional), initial encounter: Secondary | ICD-10-CM

## 2018-06-28 DIAGNOSIS — Z87891 Personal history of nicotine dependence: Secondary | ICD-10-CM | POA: Diagnosis not present

## 2018-06-28 DIAGNOSIS — Z79899 Other long term (current) drug therapy: Secondary | ICD-10-CM | POA: Insufficient documentation

## 2018-06-28 DIAGNOSIS — E119 Type 2 diabetes mellitus without complications: Secondary | ICD-10-CM | POA: Insufficient documentation

## 2018-06-28 DIAGNOSIS — X001XXA Exposure to smoke in uncontrolled fire in building or structure, initial encounter: Secondary | ICD-10-CM | POA: Insufficient documentation

## 2018-06-28 DIAGNOSIS — Y990 Civilian activity done for income or pay: Secondary | ICD-10-CM | POA: Diagnosis not present

## 2018-06-28 DIAGNOSIS — Y9389 Activity, other specified: Secondary | ICD-10-CM | POA: Insufficient documentation

## 2018-06-28 DIAGNOSIS — I10 Essential (primary) hypertension: Secondary | ICD-10-CM | POA: Insufficient documentation

## 2018-06-28 DIAGNOSIS — Y9289 Other specified places as the place of occurrence of the external cause: Secondary | ICD-10-CM | POA: Diagnosis not present

## 2018-06-28 DIAGNOSIS — R05 Cough: Secondary | ICD-10-CM | POA: Insufficient documentation

## 2018-06-28 MED ORDER — DEXAMETHASONE SODIUM PHOSPHATE 10 MG/ML IJ SOLN
10.0000 mg | Freq: Once | INTRAMUSCULAR | Status: AC
  Administered 2018-06-28: 10 mg via INTRAMUSCULAR
  Filled 2018-06-28: qty 1

## 2018-06-28 MED ORDER — DEXAMETHASONE 4 MG PO TABS: 4.0000 mg | ORAL_TABLET | Freq: Two times a day (BID) | ORAL | 0 refills | Status: DC

## 2018-06-28 MED ORDER — ALBUTEROL SULFATE HFA 108 (90 BASE) MCG/ACT IN AERS
2.0000 | INHALATION_SPRAY | Freq: Once | RESPIRATORY_TRACT | Status: AC
  Administered 2018-06-28: 2 via RESPIRATORY_TRACT
  Filled 2018-06-28: qty 6.7
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
## 2018-06-28 NOTE — Telephone Encounter (Signed)
noted 

## 2018-06-28 NOTE — Discharge Instructions (Addendum)
Your vital signs are within normal limits.  Your oxygen level is 96 to 98% on room air.  Your chest x-ray is negative for acute changes or problems.  Please use 2 puffs of albuterol every 4 hours over the next 4 or 5 days.  Please use Decadron 2 times daily with a meal.  Inhaling fresh air in the afternoons will be helpful.  Please see Dr. Yong Channel for additional evaluation if not improving.  Please return to the emergency department if any unusual shortness of breath, coughing up blood, changes in your condition, problems or concerns.

## 2018-07-12 ENCOUNTER — Ambulatory Visit: Payer: BLUE CROSS/BLUE SHIELD | Admitting: Family Medicine

## 2018-07-18 ENCOUNTER — Other Ambulatory Visit: Payer: Self-pay

## 2018-07-18 MED ORDER — LISINOPRIL-HYDROCHLOROTHIAZIDE 20-25 MG PO TABS: 1.0000 | ORAL_TABLET | Freq: Every day | ORAL | 3 refills | Status: DC

## 2018-07-19 ENCOUNTER — Ambulatory Visit (INDEPENDENT_AMBULATORY_CARE_PROVIDER_SITE_OTHER): Payer: 59 | Admitting: Family Medicine

## 2018-07-19 ENCOUNTER — Encounter: Payer: Self-pay | Admitting: Family Medicine

## 2018-07-19 ENCOUNTER — Ambulatory Visit: Payer: 59 | Admitting: Family Medicine

## 2018-07-19 VITALS — BP 110/80 | HR 92 | Temp 98.5°F | Ht 76.0 in | Wt 251.0 lb

## 2018-07-19 DIAGNOSIS — E119 Type 2 diabetes mellitus without complications: Secondary | ICD-10-CM

## 2018-07-19 DIAGNOSIS — E785 Hyperlipidemia, unspecified: Secondary | ICD-10-CM | POA: Diagnosis not present

## 2018-07-19 DIAGNOSIS — Z23 Encounter for immunization: Secondary | ICD-10-CM | POA: Diagnosis not present

## 2018-07-19 DIAGNOSIS — Z0289 Encounter for other administrative examinations: Secondary | ICD-10-CM

## 2018-07-19 DIAGNOSIS — I10 Essential (primary) hypertension: Secondary | ICD-10-CM | POA: Diagnosis not present

## 2018-07-19 LAB — POC URINALSYSI DIPSTICK (AUTOMATED)
Bilirubin, UA: NEGATIVE
Blood, UA: NEGATIVE
Glucose, UA: NEGATIVE
Ketones, UA: NEGATIVE
Leukocytes, UA: NEGATIVE
Nitrite, UA: NEGATIVE
Protein, UA: NEGATIVE
Spec Grav, UA: >=1.03 — AB (ref 1.010–1.025)
Urobilinogen, UA: 0.2 E.U./dL
pH, UA: 5 (ref 5.0–8.0)

## 2018-07-19 NOTE — Assessment & Plan Note (Signed)
S: poorly controlled on atorvastatin 40mg - thinks taking 50-60% of the time.  Lab Results  Component Value Date   CHOL 210 (H) 12/31/2017   HDL 44.90 12/31/2017   LDLCALC 136 (H) 12/31/2017   LDLDIRECT 141.8 04/28/2013   TRIG 143.0 12/31/2017   CHOLHDL 5 12/31/2017   A/P: update lipids- may be worth using rosuvastaitn 40mg  if LDL above 100 since he tends to miss doses

## 2018-07-19 NOTE — Assessment & Plan Note (Signed)
S: controlled on Lisinopril hctz 20-25mg  BP Readings from Last 3 Encounters:  07/19/18 110/80  06/28/18 134/81  04/12/18 110/82  A/P: We discussed blood pressure goal of <140/90. Continue current meds

## 2018-07-19 NOTE — Patient Instructions (Addendum)
Health Maintenance Due  Topic Date Due  . PNEUMOCOCCAL POLYSACCHARIDE VACCINE AGE 50-64 HIGH RISK -completed today 11/29/1969  . FOOT EXAM -completed today 11/29/1977  . OPHTHALMOLOGY EXAM - please get yearly eye exam and have them send [place] a copy 11/29/1977   Please stop by lab before you go  No changes today
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
## 2018-07-19 NOTE — Assessment & Plan Note (Signed)
S: diet controlled last visit with improved diet. Recent dexamethasone so CBGs may be up Lab Results  Component Value Date   HGBA1C 6.6 (A) 04/12/2018   HGBA1C 7.3 (H) 12/31/2017   A/P: he wants to remain off meds and work on lifestyle. We discussed 4 month f/u if a1c is above 7 but 6 month if remains below 7- he is agreeable

## 2018-07-20 LAB — HEMOGLOBIN A1C
Hgb A1c MFr Bld: 7.4 % of total Hgb — ABNORMAL HIGH (ref <5.7)
Mean Plasma Glucose: 166 (calc)
eAG (mmol/L): 9.2 (calc)

## 2018-07-20 LAB — COMPREHENSIVE METABOLIC PANEL
AG Ratio: 1.5 (calc) (ref 1.0–2.5)
ALT: 36 U/L (ref 9–46)
AST: 33 U/L (ref 10–35)
Albumin: 4.7 g/dL (ref 3.6–5.1)
Alkaline phosphatase (APISO): 53 U/L (ref 40–115)
BUN: 16 mg/dL (ref 7–25)
CO2: 22 mmol/L (ref 20–32)
Calcium: 10.2 mg/dL (ref 8.6–10.3)
Chloride: 100 mmol/L (ref 98–110)
Creat: 1.3 mg/dL (ref 0.70–1.33)
Globulin: 3.2 g/dL (calc) (ref 1.9–3.7)
Glucose, Bld: 93 mg/dL (ref 65–99)
Potassium: 4 mmol/L (ref 3.5–5.3)
Sodium: 138 mmol/L (ref 135–146)
Total Bilirubin: 1 mg/dL (ref 0.2–1.2)
Total Protein: 7.9 g/dL (ref 6.1–8.1)

## 2018-07-20 LAB — LDL CHOLESTEROL, DIRECT: Direct LDL: 133 mg/dL — ABNORMAL HIGH (ref <100)
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
## 2018-12-23 DIAGNOSIS — H5213 Myopia, bilateral: Secondary | ICD-10-CM | POA: Diagnosis not present

## 2018-12-23 DIAGNOSIS — E119 Type 2 diabetes mellitus without complications: Secondary | ICD-10-CM | POA: Diagnosis not present

## 2018-12-23 LAB — HM DIABETES EYE EXAM

## 2018-12-26 ENCOUNTER — Encounter: Payer: Self-pay | Admitting: Family Medicine

## 2019-01-20 ENCOUNTER — Other Ambulatory Visit: Payer: Self-pay | Admitting: Family Medicine

## 2019-03-07 ENCOUNTER — Telehealth: Payer: Self-pay | Admitting: Family Medicine

## 2019-03-07 NOTE — Telephone Encounter (Signed)
LVM for patient to call back and schedule a follow up visit with Dr. Yong Channel since he has not been seen since 07/19/18.

## 2019-03-14 ENCOUNTER — Ambulatory Visit: Payer: 59 | Admitting: Family Medicine

## 2019-03-21 ENCOUNTER — Encounter: Payer: Self-pay | Admitting: Family Medicine

## 2019-03-21 ENCOUNTER — Ambulatory Visit (INDEPENDENT_AMBULATORY_CARE_PROVIDER_SITE_OTHER): Payer: 59 | Admitting: Family Medicine

## 2019-03-21 VITALS — Ht 76.0 in

## 2019-03-21 DIAGNOSIS — E119 Type 2 diabetes mellitus without complications: Secondary | ICD-10-CM | POA: Diagnosis not present

## 2019-03-21 DIAGNOSIS — G43109 Migraine with aura, not intractable, without status migrainosus: Secondary | ICD-10-CM | POA: Diagnosis not present

## 2019-03-21 DIAGNOSIS — E1159 Type 2 diabetes mellitus with other circulatory complications: Secondary | ICD-10-CM | POA: Diagnosis not present

## 2019-03-21 DIAGNOSIS — Z79899 Other long term (current) drug therapy: Secondary | ICD-10-CM

## 2019-03-21 DIAGNOSIS — I10 Essential (primary) hypertension: Secondary | ICD-10-CM

## 2019-03-21 DIAGNOSIS — I152 Hypertension secondary to endocrine disorders: Secondary | ICD-10-CM

## 2019-03-21 DIAGNOSIS — E669 Obesity, unspecified: Secondary | ICD-10-CM

## 2019-03-21 MED ORDER — LANSOPRAZOLE 30 MG PO CPDR: 30.0000 mg | DELAYED_RELEASE_CAPSULE | Freq: Every day | ORAL | 3 refills | Status: DC

## 2019-03-21 MED ORDER — LISINOPRIL-HYDROCHLOROTHIAZIDE 20-25 MG PO TABS: 1.0000 | ORAL_TABLET | Freq: Every day | ORAL | 3 refills | Status: DC

## 2019-03-21 MED ORDER — FLURBIPROFEN 50 MG PO TABS: 50.0000 mg | ORAL_TABLET | Freq: Two times a day (BID) | ORAL | 2 refills | Status: DC | PRN

## 2019-03-21 NOTE — Patient Instructions (Addendum)
Health Maintenance Due  Topic Date Due  . HEMOGLOBIN A1C  01/17/2019  He will come off of this  Depression screen PHQ 2/9 12/25/2017  Decreased Interest 0  Down, Depressed, Hopeless 0  PHQ - 2 Score 0  Team will call to update this  Be a visit

## 2019-03-21 NOTE — Progress Notes (Signed)
Phone [phone]   Subjective:  Virtual visit via Video note. Chief complaint: Chief Complaint  Patient presents with  . Hypertension    Follow up visit   . Medication Refill    This visit type was conducted due to national recommendations for restrictions regarding the COVID-19 Pandemic (e.g. social distancing).  This format is felt to be most appropriate for this patient at this time balancing risks to patient and risks to population by having him in for in person visit.  No physical exam was performed (except for noted visual exam or audio findings with Telehealth visits).    Our team/I connected with Trevor Flowers on 03/21/19 at  4:20 PM EDT by a video enabled telemedicine application ([email or web address]) and verified that I am speaking with the correct person using two identifiers.  Location patient: [place] Location provider: [facility], office Persons participating in the virtual visit:  patient  Our team/I discussed the limitations of evaluation and management by telemedicine and the availability of in person appointments. In light of current covid-19 pandemic, patient also understands that we are trying to protect them by minimizing in office contact if at all possible.  The patient expressed consent for telemedicine visit and agreed to proceed. Patient understands insurance will be billed.   ROS-no fever/chills/cough/sore throat/shortness of breath  Past Medical History-  Patient Active Problem List   Diagnosis Date Noted  . Diabetes mellitus type II, controlled ([place]) 06/09/2016    Priority: High  . History of adenomatous polyp of colon 03/20/2018    Priority: Medium  . Hypertension associated with diabetes ([place]) 11/13/2010    Priority: Medium  . Sleep apnea 01/07/2009    Priority: Medium  . Hyperlipidemia associated with type 2 diabetes mellitus ([place]) 01/08/2008    Priority: Medium  . Migraine 12/04/2007    Priority: Medium  . Former smoker 06/03/2015    Priority: Low   . Allergic rhinitis 12/24/2014    Priority: Low  . GERD (gastroesophageal reflux disease) 11/14/1999    Priority: Low  . Quadriceps tendinitis 11/17/2015    Medications- reviewed and updated Current Outpatient Medications  Medication Sig Dispense Refill  . atorvastatin (LIPITOR) 40 MG tablet TAKE 1 TABLET BY MOUTH EVERY DAY 90 tablet 1  . flurbiprofen (ANSAID) 50 MG tablet Take 1 tablet (50 mg total) by mouth 2 (two) times daily as needed (migraines). 30 tablet 2  . lansoprazole (PREVACID) 30 MG capsule Take 1 capsule (30 mg total) by mouth daily at 12 noon. 90 capsule 3  . lisinopril-hydrochlorothiazide (ZESTORETIC) 20-25 MG tablet Take 1 tablet by mouth daily. 90 tablet 3  . loratadine (CLARITIN) 10 MG tablet Take 10 mg by mouth daily.     No current facility-administered medications for this visit.      Objective:  Ht 6\' 4"  (1.93 m)   BMI 30.55 kg/m  self reported vitals Gen: NAD, resting comfortably Lungs: nonlabored, normal respiratory rate  Skin: appears dry, no obvious rash    Assessment and Plan   #hypertension S: controlled on lisniopril hctz 20-25mg  last visit- has nto been able to check pressure recently as doesn't have a cuff BP Readings from Last 3 Encounters:  07/19/18 110/80  06/28/18 134/81  04/12/18 110/82  A/P: Hopefully remains controlled-continue current medication - encouraged him to get a home cuff with goal less than 140/90  #hyperlipidemia S: Poorly controlled on last check-now on atorvastatin 40 mg Lab Results  Component Value Date   CHOL 210 (H) 12/31/2017  HDL 44.90 12/31/2017   LDLCALC 136 (H) 12/31/2017   LDLDIRECT 133 (H) 07/19/2018   TRIG 143.0 12/31/2017   CHOLHDL 5 12/31/2017   A/P: Patient will come back for updated lipid panel-hopefully LDL at least under 100 -In the past after labs patient has admitted infrequent use of atorvastatin such as twice a week-we will check on compliance levels if levels elevated  # Diabetes S: Mild  poorly controlled on no medication at last visit-patient will need to work on diet and exercise instead of starting medication  Lab Results  Component Value Date   HGBA1C 7.4 (H) 07/19/2018   HGBA1C 6.6 (A) 04/12/2018   HGBA1C 7.3 (H) 12/31/2017   A/P: Patient agrees to come back for updated A1c- he also agrees if A1c is above 7 to consider metformin at least 500 mg daily-if over 88 would likely start at least 500 mg twice daily   #Migraine headaches S: Pattern migraines has been good recently- states infrequent headaches but when he does need to take ibuprofen has been helpful-previously from Dr. Doran Durand A/P:  Stable. Continue current medications.  Refilled medication.  Get BMP given NSAID use  #Obese- Encouraged need for healthy eating, regular exercise, weight loss.    Advised labs in 1 to 2 weeks-advised he needs to be over 19 symptom-free-encouraged him to wear a mask from home.  Advised physical in 4 to 6 months.  Lab/Order associations: Controlled type 2 diabetes mellitus without complication, without long-term current use of insulin (HCC) - Plan: Hemoglobin A1c, CBC, Comprehensive metabolic panel, Lipid panel  High risk medication use - Plan: Vitamin B12  Hypertension associated with diabetes (HCC)  Migraine with aura and without status migrainosus, not intractable  Obesity (BMI 30.0-34.9)  Meds ordered this encounter  Medications  . lisinopril-hydrochlorothiazide (ZESTORETIC) 20-25 MG tablet    Sig: Take 1 tablet by mouth daily.    Dispense:  90 tablet    Refill:  3  . lansoprazole (PREVACID) 30 MG capsule    Sig: Take 1 capsule (30 mg total) by mouth daily at 12 noon.    Dispense:  90 capsule    Refill:  3  . flurbiprofen (ANSAID) 50 MG tablet    Sig: Take 1 tablet (50 mg total) by mouth 2 (two) times daily as needed (migraines).    Dispense:  30 tablet    Refill:  2   Return precautions advised.  Trevor Reddish, MD

## 2019-03-24 NOTE — Progress Notes (Signed)
Left message for pt to return call.

## 2019-03-25 ENCOUNTER — Other Ambulatory Visit: Payer: Self-pay

## 2019-03-25 ENCOUNTER — Telehealth: Payer: Self-pay

## 2019-03-25 DIAGNOSIS — K219 Gastro-esophageal reflux disease without esophagitis: Secondary | ICD-10-CM

## 2019-03-25 MED ORDER — ESOMEPRAZOLE MAGNESIUM 40 MG PO CPDR: 40.0000 mg | DELAYED_RELEASE_CAPSULE | Freq: Every day | ORAL | 3 refills | Status: DC

## 2019-03-25 NOTE — Assessment & Plan Note (Signed)
From February note "GERD- on omeprazole 40mg  rx. He has taken lansoprazole in the past at 30mg  which was far more effective- we will change pills due to current breakthrough symptoms at times. Discussed weight loss. "  Can try esomeprazole 40mg  Daily #90 with 3 refills- take lansoprazole off list and inform patient.

## 2019-03-25 NOTE — Telephone Encounter (Signed)
Made in error

## 2019-03-25 NOTE — Telephone Encounter (Signed)
From February note "GERD- on omeprazole 40mg  rx. He has taken lansoprazole in the past at 30mg  which was far more effective- we will change pills due to current breakthrough symptoms at times. Discussed weight loss. "  Can try esomeprazole 40mg  Daily #90 with 3 refills- take lansoprazole off list and inform patient.

## 2019-03-25 NOTE — Telephone Encounter (Signed)
I left patient a detailed message about the change in medication and why.  Asked patient to call office if he has any questions.  Rx sent in.

## 2019-03-25 NOTE — Telephone Encounter (Signed)
Rx problem. Lansoprazole DR 30MG   Not covered by plan. Can you prescribe an alternative med?

## 2019-03-25 NOTE — Progress Notes (Signed)
Change in medication Lasoprazole 30MG  to Esomesprazole 40MG .

## 2019-03-27 NOTE — Progress Notes (Signed)
I left a detailed message to call office back.

## 2019-03-28 NOTE — Progress Notes (Signed)
I left a detailed message to call office back.

## 2019-03-28 NOTE — Progress Notes (Signed)
I left a detailed message for patient to contact office.

## 2019-03-31 NOTE — Progress Notes (Signed)
Called pt to but no answer again. Will try again tomorrow.

## 2019-04-01 NOTE — Progress Notes (Signed)
Called pt multiple times again but no answer.

## 2019-08-21 NOTE — Patient Instructions (Addendum)
Health Maintenance Due  Topic Date Due  . HIV Screening - in future when have insurance 11/29[birth year]1982  . HEMOGLOBIN A1C -today 01/17/2019   Exercise 30 minutes daily- walking is ok, more intense if you can tolerate it.   See diabetes education videos- we really need to try to work on diet to get levels down in addition to medicine.  Smiley Houseman, DO diabetes education part 1- and do all parts listed up to part 5  Start metformin 1000mg  twice a day 1. Week one half do 1 tablet In AM 2. Week two do 1 half tablet in AM and PM 3. Week 3 full tablet AM, half tablet PM 4. Week 4 full tablet twice a day  Start glimepiride 2 mg daily  Schedule 3 month and 1 day follow up  Give me an update on mychart in 1 month with how your blood sugars are doing

## 2019-08-21 NOTE — Progress Notes (Signed)
Phone [phone]   Subjective:  Trevor Flowers is a 53 y.o. year old very pleasant male patient who presents for/with See problem oriented charting Chief Complaint  Patient presents with  . Follow-up  . Diabetes   ROS-reports increased urination, fatigue.  No fever/chills/cough  Past Medical History-  Patient Active Problem List   Diagnosis Date Noted  . Diabetes mellitus type II, controlled ([place]) 06/09/2016    Priority: High  . History of adenomatous polyp of colon 03/20/2018    Priority: Medium  . Hypertension associated with diabetes ([place]) 11/13/2010    Priority: Medium  . Sleep apnea 01/07/2009    Priority: Medium  . Hyperlipidemia associated with type 2 diabetes mellitus ([place]) 01/08/2008    Priority: Medium  . Migraine 12/04/2007    Priority: Medium  . Former smoker 06/03/2015    Priority: Low  . Allergic rhinitis 12/24/2014    Priority: Low  . GERD (gastroesophageal reflux disease) 11/14/1999    Priority: Low  . Quadriceps tendinitis 11/17/2015    Medications- reviewed and updated Current Outpatient Medications  Medication Sig Dispense Refill  . atorvastatin (LIPITOR) 40 MG tablet TAKE 1 TABLET BY MOUTH EVERY DAY 90 tablet 1  . esomeprazole (NEXIUM) 40 MG capsule Take 1 capsule (40 mg total) by mouth daily. 90 capsule 3  . flurbiprofen (ANSAID) 50 MG tablet Take 1 tablet (50 mg total) by mouth 2 (two) times daily as needed (migraines). 30 tablet 2  . lisinopril-hydrochlorothiazide (ZESTORETIC) 20-25 MG tablet Take 1 tablet by mouth daily. 90 tablet 3  . loratadine (CLARITIN) 10 MG tablet Take 10 mg by mouth daily.    Marland Kitchen glimepiride (AMARYL) 2 MG tablet Take 1 tablet (2 mg total) by mouth daily before breakfast. 30 tablet 5  . metFORMIN (GLUCOPHAGE) 1000 MG tablet Take 1 tablet (1,000 mg total) by mouth 2 (two) times daily with a meal. 60 tablet 5   No current facility-administered medications for this visit.      Objective:  BP 122/70   Pulse 87    Temp 98.2 F (36.8 C)   Ht 6\' 4"  (1.93 m)   Wt 245 lb (111.1 kg)   SpO2 97%   BMI 29.82 kg/m  Gen: NAD, resting comfortably CV: RRR no murmurs rubs or gallops Lungs: CTAB no crackles, wheeze, rhonchi Abdomen: soft/nontender/nondistended/normal bowel sounds.  Ext: no edema Skin: warm, dry     Assessment and Plan   # Diabetes S: Very poorly controlled on diet and exercise, pt feels his sugars are elevated. Pt states his energy levels aren't where he wants them to be and he would like to try a low dose of testosterone meds. CBGs-  his last check yesterday Am was 324-in general he is noted this trending up Exercise and diet-pt states his diet and exercise are a lot better. Lab Results  Component Value Date   HGBA1C 10.5 (A) 08/22/2019   HGBA1C 7.4 (H) 07/19/2018   HGBA1C 6.6 (A) 04/12/2018   A/P: Unfortunately patient is self-pay as recently lost his job in May and also states he simply cannot give himself insulin or injections-this limits options  From AVS "  Patient Instructions   Health Maintenance Due  Topic Date Due  . HIV Screening - in future when have insurance 11/29/1980  . HEMOGLOBIN A1C -today 01/17/2019   Exercise 30 minutes daily- walking is ok, more intense if you can tolerate it.   See diabetes education videos- we really need to try to work on  diet to get levels down in addition to medicine.  Smiley Houseman, DO diabetes education part 1- and do all parts listed up to part 5  Start metformin 1000mg  twice a day 1. Week one half do 1 tablet In AM 2. Week two do 1 half tablet in AM and PM 3. Week 3 full tablet AM, half tablet PM 4. Week 4 full tablet twice a day  Start glimepiride 2 mg daily  Schedule 3 month and 1 day follow up  Give me an update on mychart in 1 month with how your blood sugars are doing  "   #hypertension S: controlled on lisniopril hctz 20-25mg  BP Readings from Last 3 Encounters:  08/22/19 122/70  07/19/18 110/80  06/28/18  134/81  A/P: Stable. Continue current medications.   #hyperlipidemia S: hopefully controlled on atorvastatin 40mg   A/P: update lipid panel today  Recommended follow up: 1 month follow up   Lab/Order associations: chicken wings early this AM and a few pickles a few minutes ago   ICD-10-CM   1. Controlled type 2 diabetes mellitus without complication, without long-term current use of insulin (HCC)  E11.9 CBC    Comprehensive metabolic panel    Lipid panel    POCT HgB A1C    CANCELED: Hemoglobin A1c  2. Hypertension associated with diabetes ([place])  E11.59    I10   3. Hyperlipidemia associated with type 2 diabetes mellitus ([place])  E11.69    E78.5   4. High risk medication use  Z79.899 Vitamin B12    Meds ordered this encounter  Medications  . metFORMIN (GLUCOPHAGE) 1000 MG tablet    Sig: Take 1 tablet (1,000 mg total) by mouth 2 (two) times daily with a meal.    Dispense:  60 tablet    Refill:  5  . glimepiride (AMARYL) 2 MG tablet    Sig: Take 1 tablet (2 mg total) by mouth daily before breakfast.    Dispense:  30 tablet    Refill:  5   Return precautions advised.  Garret Reddish, MD

## 2019-08-22 ENCOUNTER — Other Ambulatory Visit: Payer: Self-pay

## 2019-08-22 ENCOUNTER — Ambulatory Visit (INDEPENDENT_AMBULATORY_CARE_PROVIDER_SITE_OTHER): Payer: Self-pay | Admitting: Family Medicine

## 2019-08-22 ENCOUNTER — Encounter: Payer: Self-pay | Admitting: Family Medicine

## 2019-08-22 VITALS — BP 122/70 | HR 87 | Temp 98.2°F | Ht 76.0 in | Wt 245.0 lb

## 2019-08-22 DIAGNOSIS — E785 Hyperlipidemia, unspecified: Secondary | ICD-10-CM

## 2019-08-22 DIAGNOSIS — I152 Hypertension secondary to endocrine disorders: Secondary | ICD-10-CM

## 2019-08-22 DIAGNOSIS — I10 Essential (primary) hypertension: Secondary | ICD-10-CM

## 2019-08-22 DIAGNOSIS — Z79899 Other long term (current) drug therapy: Secondary | ICD-10-CM

## 2019-08-22 DIAGNOSIS — E1159 Type 2 diabetes mellitus with other circulatory complications: Secondary | ICD-10-CM

## 2019-08-22 DIAGNOSIS — E1169 Type 2 diabetes mellitus with other specified complication: Secondary | ICD-10-CM

## 2019-08-22 DIAGNOSIS — E119 Type 2 diabetes mellitus without complications: Secondary | ICD-10-CM

## 2019-08-22 LAB — POCT GLYCOSYLATED HEMOGLOBIN (HGB A1C): Hemoglobin A1C: 10.5 % — AB (ref 4.0–5.6)

## 2019-08-22 MED ORDER — METFORMIN HCL 1000 MG PO TABS
1000.0000 mg | ORAL_TABLET | Freq: Two times a day (BID) | ORAL | 5 refills | Status: DC
Start: 2019-08-22 — End: 2020-05-26

## 2019-08-22 MED ORDER — GLIMEPIRIDE 2 MG PO TABS: 2.0000 mg | ORAL_TABLET | Freq: Every day | ORAL | 5 refills | Status: DC

## 2019-08-22 NOTE — Addendum Note (Signed)
Addended by: Tomi Likens on: 08/22/2019 04:12 PM   Modules accepted: Orders

## 2019-08-23 LAB — LIPID PANEL
Cholesterol: 223 mg/dL — ABNORMAL HIGH (ref <200)
HDL: 41 mg/dL (ref >=40)
LDL Cholesterol (Calc): 150 mg/dL (calc) — ABNORMAL HIGH
Non-HDL Cholesterol (Calc): 182 mg/dL (calc) — ABNORMAL HIGH (ref <130)
Total CHOL/HDL Ratio: 5.4 (calc) — ABNORMAL HIGH (ref <5.0)
Triglycerides: 186 mg/dL — ABNORMAL HIGH (ref <150)

## 2019-08-23 LAB — COMPREHENSIVE METABOLIC PANEL
AG Ratio: 1.6 (calc) (ref 1.0–2.5)
ALT: 77 U/L — ABNORMAL HIGH (ref 9–46)
AST: 57 U/L — ABNORMAL HIGH (ref 10–35)
Albumin: 4.6 g/dL (ref 3.6–5.1)
Alkaline phosphatase (APISO): 70 U/L (ref 35–144)
BUN: 15 mg/dL (ref 7–25)
CO2: 27 mmol/L (ref 20–32)
Calcium: 9.9 mg/dL (ref 8.6–10.3)
Chloride: 99 mmol/L (ref 98–110)
Creat: 1.16 mg/dL (ref 0.70–1.33)
Globulin: 2.8 g/dL (calc) (ref 1.9–3.7)
Glucose, Bld: 221 mg/dL — ABNORMAL HIGH (ref 65–99)
Potassium: 3.7 mmol/L (ref 3.5–5.3)
Sodium: 135 mmol/L (ref 135–146)
Total Bilirubin: 0.9 mg/dL (ref 0.2–1.2)
Total Protein: 7.4 g/dL (ref 6.1–8.1)

## 2019-08-23 LAB — CBC
HCT: 45.5 % (ref 38.5–50.0)
Hemoglobin: 15.5 g/dL (ref 13.2–17.1)
MCH: 29.6 pg (ref 27.0–33.0)
MCHC: 34.1 g/dL (ref 32.0–36.0)
MCV: 86.8 fL (ref 80.0–100.0)
MPV: 9.8 fL (ref 7.5–12.5)
Platelets: 353 10*3/uL (ref 140–400)
RBC: 5.24 10*6/uL (ref 4.20–5.80)
RDW: 12.8 % (ref 11.0–15.0)
WBC: 7.1 10*3/uL (ref 3.8–10.8)

## 2019-08-23 LAB — HEMOGLOBIN A1C
Hgb A1c MFr Bld: 11.7 % of total Hgb — ABNORMAL HIGH (ref <5.7)
Mean Plasma Glucose: 289 (calc)
eAG (mmol/L): 16 (calc)

## 2019-08-23 LAB — VITAMIN B12: Vitamin B-12: 1367 pg/mL — ABNORMAL HIGH (ref 200–1100)

## 2019-11-21 ENCOUNTER — Other Ambulatory Visit: Payer: Self-pay

## 2019-11-24 ENCOUNTER — Ambulatory Visit: Payer: Self-pay | Admitting: Family Medicine

## 2019-11-24 ENCOUNTER — Other Ambulatory Visit: Payer: Self-pay | Admitting: Family Medicine

## 2019-11-24 NOTE — Progress Notes (Deleted)
  Phone [phone] In person visit   Subjective:   Trevor Flowers is a 54 y.o. year old very pleasant male patient who presents for/with See problem oriented charting No chief complaint on file.   ROS- ***   This visit occurred during the SARS-CoV-2 public health emergency.  Safety protocols were in place, including screening questions prior to the visit, additional usage of staff PPE, and extensive cleaning of exam room while observing appropriate contact time as indicated for disinfecting solutions.   Past Medical History-  Patient Active Problem List   Diagnosis Date Noted  . History of adenomatous polyp of colon 03/20/2018  . Diabetes mellitus type II, controlled ([place]) 06/09/2016  . Quadriceps tendinitis 11/17/2015  . Former smoker 06/03/2015  . Allergic rhinitis 12/24/2014  . Hypertension associated with diabetes ([place]) 11/13/2010  . Sleep apnea 01/07/2009  . Hyperlipidemia associated with type 2 diabetes mellitus ([place]) 01/08/2008  . Migraine 12/04/2007  . GERD (gastroesophageal reflux disease) 11/14/1999    Medications- reviewed and updated Current Outpatient Medications  Medication Sig Dispense Refill  . atorvastatin (LIPITOR) 40 MG tablet TAKE 1 TABLET BY MOUTH EVERY DAY 90 tablet 1  . esomeprazole (NEXIUM) 40 MG capsule Take 1 capsule (40 mg total) by mouth daily. 90 capsule 3  . flurbiprofen (ANSAID) 50 MG tablet Take 1 tablet (50 mg total) by mouth 2 (two) times daily as needed (migraines). 30 tablet 2  . glimepiride (AMARYL) 2 MG tablet Take 1 tablet (2 mg total) by mouth daily before breakfast. 30 tablet 5  . lisinopril-hydrochlorothiazide (ZESTORETIC) 20-25 MG tablet Take 1 tablet by mouth daily. 90 tablet 3  . loratadine (CLARITIN) 10 MG tablet Take 10 mg by mouth daily.    . metFORMIN (GLUCOPHAGE) 1000 MG tablet Take 1 tablet (1,000 mg total) by mouth 2 (two) times daily with a meal. 60 tablet 5   No current facility-administered medications for this  visit.     Objective:  There were no vitals taken for this visit. Gen: NAD, resting comfortably CV: RRR no murmurs rubs or gallops Lungs: CTAB no crackles, wheeze, rhonchi Abdomen: soft/nontender/nondistended/normal bowel sounds. No rebound or guarding.  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   Recommended follow up: ***No follow-ups on file. Future Appointments  Date Time Provider [place]  11/24/2019 11:00 AM Marin Olp, MD [facility]    Lab/Order associations: No diagnosis found.  No orders of the defined types were placed in this encounter.   Time Spent: *** minutes of total time (10:23 AM***- 10:23 AM***) was spent on the date of the encounter performing the following actions: chart review prior to seeing the patient, obtaining history, performing a medically necessary exam, counseling on the treatment plan, placing orders, and documenting in our EHR.   Return precautions advised.  Francella Solian, CMA

## 2019-12-19 ENCOUNTER — Ambulatory Visit: Payer: Self-pay | Admitting: Family Medicine

## 2020-01-05 ENCOUNTER — Ambulatory Visit (INDEPENDENT_AMBULATORY_CARE_PROVIDER_SITE_OTHER): Payer: BC Managed Care – PPO | Admitting: Family Medicine

## 2020-01-05 ENCOUNTER — Other Ambulatory Visit: Payer: Self-pay

## 2020-01-05 ENCOUNTER — Encounter: Payer: Self-pay | Admitting: Family Medicine

## 2020-01-05 VITALS — BP 122/80 | HR 78 | Temp 98.1°F | Ht 76.0 in | Wt 252.4 lb

## 2020-01-05 DIAGNOSIS — E1169 Type 2 diabetes mellitus with other specified complication: Secondary | ICD-10-CM | POA: Diagnosis not present

## 2020-01-05 DIAGNOSIS — I152 Hypertension secondary to endocrine disorders: Secondary | ICD-10-CM

## 2020-01-05 DIAGNOSIS — E1159 Type 2 diabetes mellitus with other circulatory complications: Secondary | ICD-10-CM | POA: Diagnosis not present

## 2020-01-05 DIAGNOSIS — I10 Essential (primary) hypertension: Secondary | ICD-10-CM | POA: Diagnosis not present

## 2020-01-05 DIAGNOSIS — E119 Type 2 diabetes mellitus without complications: Secondary | ICD-10-CM | POA: Diagnosis not present

## 2020-01-05 DIAGNOSIS — E785 Hyperlipidemia, unspecified: Secondary | ICD-10-CM

## 2020-01-05 LAB — POCT GLYCOSYLATED HEMOGLOBIN (HGB A1C): Hemoglobin A1C: 5.9 % — AB (ref 4.0–5.6)

## 2020-01-05 MED ORDER — FLURBIPROFEN 50 MG PO TABS: 50.0000 mg | ORAL_TABLET | Freq: Two times a day (BID) | ORAL | 2 refills | Status: DC | PRN

## 2020-01-05 NOTE — Patient Instructions (Addendum)
Health Maintenance Due  Topic Date Due  . HIV Screening - can consider next labs 11/29/1980  . OPHTHALMOLOGY EXAM (will have records sent over) 12/24/2019   Stop glimepiride 2 mg. If morning blood sugars get above 125 regularly you may restart. I want to avoid any lows and sometimes lows can cause headaches so I want to see if this helps.   Great job getting a1c down to 5.9- if we adjust for blood draw vs. Fingerprick still this this would be under 7- lets see how you do without glimepiride and recheck at physical in 4 months which you can schedule before you leave  Update bloodwork next visit

## 2020-01-05 NOTE — Progress Notes (Signed)
Phone [phone] In person visit   Subjective:   Trevor Flowers is a 54 y.o. year old very pleasant male patient who presents for/with See problem oriented charting Chief Complaint  Patient presents with  . Follow-up  . Diabetes  . Hyperlipidemia  . Hypertension   This visit occurred during the SARS-CoV-2 public health emergency.  Safety protocols were in place, including screening questions prior to the visit, additional usage of staff PPE, and extensive cleaning of exam room while observing appropriate contact time as indicated for disinfecting solutions.   Past Medical History-  Patient Active Problem List   Diagnosis Date Noted  . Diabetes mellitus type II, controlled ([place]) 06/09/2016    Priority: High  . History of adenomatous polyp of colon 03/20/2018    Priority: Medium  . Hypertension associated with diabetes ([place]) 11/13/2010    Priority: Medium  . Sleep apnea 01/07/2009    Priority: Medium  . Hyperlipidemia associated with type 2 diabetes mellitus ([place]) 01/08/2008    Priority: Medium  . Migraine 12/04/2007    Priority: Medium  . Former smoker 06/03/2015    Priority: Low  . Allergic rhinitis 12/24/2014    Priority: Low  . GERD (gastroesophageal reflux disease) 11/14/1999    Priority: Low  . Quadriceps tendinitis 11/17/2015    Medications- reviewed and updated Current Outpatient Medications  Medication Sig Dispense Refill  . atorvastatin (LIPITOR) 40 MG tablet TAKE 1 TABLET BY MOUTH EVERY DAY 30 tablet 5  . esomeprazole (NEXIUM) 40 MG capsule Take 1 capsule (40 mg total) by mouth daily. 90 capsule 3  . lisinopril-hydrochlorothiazide (ZESTORETIC) 20-25 MG tablet Take 1 tablet by mouth daily. 90 tablet 3  . loratadine (CLARITIN) 10 MG tablet Take 10 mg by mouth daily.    . metFORMIN (GLUCOPHAGE) 1000 MG tablet Take 1 tablet (1,000 mg total) by mouth 2 (two) times daily with a meal. 60 tablet 5  . flurbiprofen (ANSAID) 50 MG tablet Take 1 tablet (50 mg  total) by mouth 2 (two) times daily as needed (migraines). 30 tablet 2   No current facility-administered medications for this visit.     Objective:  BP 122/80   Pulse 78   Temp 98.1 F (36.7 C)   Ht 6\' 4"  (1.93 m)   Wt 252 lb 6.4 oz (114.5 kg)   SpO2 98%   BMI 30.72 kg/m  Gen: NAD, resting comfortably CV: RRR no murmurs rubs or gallops Lungs: CTAB no crackles, wheeze, rhonchi Ext: no edema Skin: warm, dry  Diabetic Foot Exam - Simple   Simple Foot Form Diabetic Foot exam was performed with the following findings: Yes 01/05/2020  4:19 PM  Visual Inspection No deformities, no ulcerations, no other skin breakdown bilaterally: Yes Sensation Testing Intact to touch and monofilament testing bilaterally: Yes Pulse Check Posterior Tibialis and Dorsalis pulse intact bilaterally: Yes Comments       Assessment and Plan   # Diabetes S: Compliant with Amaryl 2mg , metformin 1000mg  twice a day. No side effects other than occasional mild headaches CBGs- no low blood sugars. Blood sugars lowest was 93 and highest was 11.  Exercise and diet- pt does does a lot of walking and cooking at home. He is eating a lot healthier- doing more salad. Eating less as driving a truck.  Lab Results  Component Value Date   HGBA1C 5.9 (A) 01/05/2020   HGBA1C 10.5 (A) 08/22/2019   HGBA1C 11.7 (H) 08/22/2019    A/P: drastic improvement/great control  From avs "  Stop glimepiride 2 mg. If morning blood sugars get above 125 regularly you may restart. I want to avoid any lows and sometimes lows can cause headaches so I want to see if this helps.   Great job getting a1c down to 5.9- if we adjust for blood draw vs. Fingerprick still this this would be under 7- lets see how you do without glimepiride and recheck at physical in 4 months which you can schedule before you leave"  #hyperlipidemia S: compliant with Lipitor 40mg  Lab Results  Component Value Date   CHOL 223 (H) 08/22/2019   HDL 41 08/22/2019    LDLCALC 150 (H) 08/22/2019   LDLDIRECT 133 (H) 07/19/2018   TRIG 186 (H) 08/22/2019   CHOLHDL 5.4 (H) 08/22/2019   A/P: misses some doses- encouraged compliance- update lipids next visit along with LFTs (prior mild elevations- also needs to work on weight loss)  #hypertension S: compliant with Zestoretic 20-25mg  BP Readings from Last 3 Encounters:  01/05/20 122/80  08/22/19 122/70  07/19/18 110/80  A/P: excellent control- continue current meds  #migraines- sparing flurbiprofen- we refilled today   Recommended follow up: Return in about 4 months (around 05/04/2020) for physical or sooner if needed.  Lab/Order associations:   ICD-10-CM   1. Controlled type 2 diabetes mellitus without complication, without long-term current use of insulin (HCC)  E11.9 POCT HgB A1C  2. Hypertension associated with diabetes ([place]) Chronic E11.59    I10   3. Hyperlipidemia associated with type 2 diabetes mellitus ([place])  E11.69    E78.5     Meds ordered this encounter  Medications  . flurbiprofen (ANSAID) 50 MG tablet    Sig: Take 1 tablet (50 mg total) by mouth 2 (two) times daily as needed (migraines).    Dispense:  30 tablet    Refill:  2    Return precautions advised.  Garret Reddish, MD

## 2020-01-09 ENCOUNTER — Other Ambulatory Visit: Payer: Self-pay | Admitting: Family Medicine

## 2020-01-12 ENCOUNTER — Telehealth: Payer: Self-pay | Admitting: Family Medicine

## 2020-01-12 MED ORDER — FLURBIPROFEN 100 MG PO TABS: 50.0000 mg | ORAL_TABLET | Freq: Two times a day (BID) | ORAL | 3 refills | Status: DC | PRN

## 2020-01-12 NOTE — Telephone Encounter (Signed)
Patient states at last OV his flurbiprofen was denied.  States he was informed to find out why and give a call back.  Patient states that pharmacy has informed him that Flurbiprofen is no longer offered in 50mg .  States this needs to be increased.  Patient states this RX needs to go to [facility] in [place].

## 2020-01-12 NOTE — Telephone Encounter (Signed)
See below

## 2020-01-12 NOTE — Telephone Encounter (Signed)
Called and made pt aware.

## 2020-01-12 NOTE — Telephone Encounter (Signed)
I sent in 100 mg but please have him only take only half tablet of the flurbiprofen 100 mg as needed for migraines

## 2020-04-06 ENCOUNTER — Other Ambulatory Visit: Payer: Self-pay | Admitting: Family Medicine

## 2020-05-11 ENCOUNTER — Encounter: Payer: Self-pay | Admitting: Family Medicine

## 2020-05-26 ENCOUNTER — Other Ambulatory Visit: Payer: Self-pay | Admitting: Family Medicine

## 2020-07-27 DIAGNOSIS — Z20828 Contact with and (suspected) exposure to other viral communicable diseases: Secondary | ICD-10-CM | POA: Diagnosis not present

## 2020-07-29 ENCOUNTER — Other Ambulatory Visit: Payer: Self-pay | Admitting: Family Medicine

## 2020-09-20 ENCOUNTER — Encounter: Payer: Self-pay | Admitting: Family Medicine

## 2020-09-21 ENCOUNTER — Encounter: Payer: Self-pay | Admitting: Family Medicine

## 2020-09-21 ENCOUNTER — Other Ambulatory Visit: Payer: Self-pay

## 2020-09-21 ENCOUNTER — Ambulatory Visit (INDEPENDENT_AMBULATORY_CARE_PROVIDER_SITE_OTHER): Payer: BC Managed Care – PPO | Admitting: Family Medicine

## 2020-09-21 VITALS — BP 138/77 | HR 93 | Temp 98.3°F | Ht 76.0 in | Wt 255.0 lb

## 2020-09-21 DIAGNOSIS — Z87891 Personal history of nicotine dependence: Secondary | ICD-10-CM | POA: Diagnosis not present

## 2020-09-21 DIAGNOSIS — E119 Type 2 diabetes mellitus without complications: Secondary | ICD-10-CM | POA: Diagnosis not present

## 2020-09-21 DIAGNOSIS — E1169 Type 2 diabetes mellitus with other specified complication: Secondary | ICD-10-CM | POA: Diagnosis not present

## 2020-09-21 DIAGNOSIS — Z0000 Encounter for general adult medical examination without abnormal findings: Secondary | ICD-10-CM | POA: Diagnosis not present

## 2020-09-21 DIAGNOSIS — G4730 Sleep apnea, unspecified: Secondary | ICD-10-CM

## 2020-09-21 DIAGNOSIS — Z125 Encounter for screening for malignant neoplasm of prostate: Secondary | ICD-10-CM

## 2020-09-21 DIAGNOSIS — Z79899 Other long term (current) drug therapy: Secondary | ICD-10-CM | POA: Diagnosis not present

## 2020-09-21 DIAGNOSIS — Z122 Encounter for screening for malignant neoplasm of respiratory organs: Secondary | ICD-10-CM

## 2020-09-21 DIAGNOSIS — G43109 Migraine with aura, not intractable, without status migrainosus: Secondary | ICD-10-CM

## 2020-09-21 DIAGNOSIS — I152 Hypertension secondary to endocrine disorders: Secondary | ICD-10-CM

## 2020-09-21 DIAGNOSIS — E1159 Type 2 diabetes mellitus with other circulatory complications: Secondary | ICD-10-CM | POA: Diagnosis not present

## 2020-09-21 DIAGNOSIS — E785 Hyperlipidemia, unspecified: Secondary | ICD-10-CM

## 2020-09-21 LAB — POC URINALSYSI DIPSTICK (AUTOMATED)
Bilirubin, UA: NEGATIVE
Blood, UA: NEGATIVE
Glucose, UA: NEGATIVE
Ketones, UA: NEGATIVE
Leukocytes, UA: NEGATIVE
Nitrite, UA: NEGATIVE
Protein, UA: NEGATIVE
Spec Grav, UA: 1.02 (ref 1.010–1.025)
Urobilinogen, UA: 0.2 E.U./dL
pH, UA: 6.5 (ref 5.0–8.0)

## 2020-09-21 MED ORDER — ESOMEPRAZOLE MAGNESIUM 20 MG PO CPDR: 20.0000 mg | DELAYED_RELEASE_CAPSULE | Freq: Every day | ORAL | 3 refills | Status: DC

## 2020-09-21 NOTE — Addendum Note (Signed)
Addended by: Jerrel Ivory D on: 09/21/2020 11:41 AM   Modules accepted: Orders

## 2020-09-21 NOTE — Progress Notes (Signed)
Phone: [phone]   Subjective:  Patient presents today for their annual physical. Chief complaint-noted.   See problem oriented charting- Review of Systems  Constitutional: Negative for chills and fever.  HENT: Negative for hearing loss and tinnitus.   Eyes: Negative for blurred vision and double vision.  Respiratory: Negative for cough and shortness of breath.   Cardiovascular: Negative for chest pain and palpitations.  Gastrointestinal: Negative for constipation, diarrhea, nausea and vomiting.  Genitourinary: Negative for dysuria and urgency.  Musculoskeletal: Positive for joint pain (knee pain). Negative for myalgias.  Skin: Negative for itching and rash.  Neurological: Negative for dizziness and headaches (rare).  Endo/Heme/Allergies: Negative for polydipsia. Does not bruise/bleed easily.  Psychiatric/Behavioral: Negative for depression and suicidal ideas.   The following were reviewed and entered/updated in epic: Past Medical History:  Diagnosis Date  . Allergy   . GERD (gastroesophageal reflux disease)   . HYPERLIPIDEMIA 01/08/2008  . Hypertension   . Migraine 12/04/2007  . Sleep apnea    uses cpap  . TRANSAMINASES, SERUM, ELEVATED 01/08/2008   resolved on repeat   Patient Active Problem List   Diagnosis Date Noted  . Diabetes mellitus type II, controlled ([place]) 06/09/2016    Priority: High  . History of adenomatous polyp of colon 03/20/2018    Priority: Medium  . Hypertension associated with diabetes ([place]) 11/13/2010    Priority: Medium  . Sleep apnea 01/07/2009    Priority: Medium  . Hyperlipidemia associated with type 2 diabetes mellitus ([place]) 01/08/2008    Priority: Medium  . Migraine 12/04/2007    Priority: Medium  . Former smoker 06/03/2015    Priority: Low  . Allergic rhinitis 12/24/2014    Priority: Low  . GERD (gastroesophageal reflux disease) 11/14/1999    Priority: Low  . Quadriceps tendinitis 11/17/2015   Past Surgical History:  Procedure  Laterality Date  . none      Family History  Problem Relation Age of Onset  . Hypertension Mother   . Other Father        unknown history  . Asthma Sister   . Colon cancer Neg Hx   . Colon polyps Neg Hx   . Esophageal cancer Neg Hx   . Rectal cancer Neg Hx   . Stomach cancer Neg Hx     Medications- reviewed and updated Current Outpatient Medications  Medication Sig Dispense Refill  . atorvastatin (LIPITOR) 40 MG tablet TAKE 1 TABLET BY MOUTH EVERY DAY 30 tablet 5  . flurbiprofen (ANSAID) 100 MG tablet Take 0.5 tablets (50 mg total) by mouth 2 (two) times daily as needed (migraines). 30 tablet 3  . lisinopril-hydrochlorothiazide (ZESTORETIC) 20-25 MG tablet TAKE 1 TABLET BY MOUTH EVERY DAY 90 tablet 1  . loratadine (CLARITIN) 10 MG tablet Take 10 mg by mouth daily.    . metFORMIN (GLUCOPHAGE) 1000 MG tablet TAKE 1 TABLET BY MOUTH TWICE A DAY WITH FOOD 60 tablet 5  . esomeprazole (NEXIUM) 20 MG capsule Take 1 capsule (20 mg total) by mouth daily. 90 capsule 3   No current facility-administered medications for this visit.   Allergies-reviewed and updated Allergies  Allergen Reactions  . Mold Extract [Trichophyton]    Social History   Social History Narrative   Married (wife patient here- in process of divorce (states wife wants to do drugs)). Son and daughter 67 and 55 in 2016.       Works as Market researcher as Marketing executive      Hobbies: 4  wheeler, live in country in [place]   Objective  Objective:  BP 138/77   Pulse 93   Temp 98.3 F (36.8 C) (Temporal)   Ht 6\' 4"  (1.93 m)   Wt 255 lb (115.7 kg)   SpO2 98%   BMI 31.04 kg/m  Gen: NAD, resting comfortably HEENT: Mucous membranes are moist. Oropharynx normal Neck: no thyromegaly or cervical lympadenopathy CV: RRR no murmurs rubs or gallops Lungs: CTAB no crackles, wheeze, rhonchi Abdomen: soft/nontender/nondistended/normal bowel sounds. No rebound or guarding.  Ext: no  edema Skin: warm, dry Neuro: grossly normal, moves all extremities, PERRLA   Assessment and Plan  54 y.o. male presenting for annual physical.  Health Maintenance counseling: 1. Anticipatory guidance: Patient counseled regarding regular dental exams -q6 months, eye exams -yearly encouraged-strongly encouraged diabetic eye exam with last over a year or two ago,  avoiding smoking and second hand smoke , limiting alcohol to 2 beverages per day.   2. Risk factor reduction:  Advised patient of need for regular exercise and diet rich and fruits and vegetables to reduce risk of heart attack and stroke. Exercise- trying to go to [facility] today to start membership. Diet-picked up a few lbs (some stress eating as daughter had covid- apparently rest of family did ok thankfully including him).  Weight essentially stable from last physical February 2019 Wt Readings from Last 3 Encounters:  09/21/20 255 lb (115.7 kg)  01/05/20 252 lb 6.4 oz (114.5 kg)  08/22/19 245 lb (111.1 kg)  3. Immunizations/screenings/ancillary studies-declines flu shot.  Discussed one-time lifetime HIV screening-opts out. Immunization History  Administered Date(s) Administered  . Moderna SARS-COVID-2 Vaccination 05/27/2020, 07/08/2020  . Pneumococcal Polysaccharide-23 07/19/2018  . Td 12/15/2007  . Tdap 01/08/2008, 04/12/2018  4. Prostate cancer screening- low risk prior PSA trend-we will trend labs today with PSA. Declines rectal exam Lab Results  Component Value Date   PSA 1.17 12/31/2017   PSA 0.99 06/01/2016   PSA 0.87 05/27/2015   5. Colon cancer screening - 03/14/2018 with plan for 5 year repeat with adenomatous polyp I believe 6. Skin cancer screening-low risk due to melanin content. advised regular sunscreen use. Denies worrisome, changing, or new skin lesions.  7.  Former smoker-22 pack years and quit in 2008.  We will plan on AAA scan at 89.  With newer guidelines would qualify for lung cancer screening-offered this today  and opts in as affordable  8. STD screening - opts out as monogamous  Status of chronic or acute concerns   # Diabetes S: Medication:Amaryl 2 mg prior to last visit but was stopped at that time with plan to restart if sugar got above 125, Metformin 1000 mg twice daily- he stopped taking this as well as sugars were looking good aroudn 100 CBGs- sugars spiked 170s fasting in recent weeks- restart metformin on Saturday. Down to 151 again this Am Exercise and diet- last visit was eating much healthier including more vegetables/salads. 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 Needs to restart exercise. Last 3 weeks reports was not eating super well Lab Results  Component Value Date   HGBA1C 5.9 (A) 01/05/2020   HGBA1C 10.5 (A) 08/22/2019   HGBA1C 11.7 (H) 08/22/2019  A/P: Drastic improvement last visit.  We stopped glimepiride with plans to restart if morning sugars were above 125.  He unfortunately also stopped metformin. Update A1c with labs today-likely at least continue Metformin but suspect we may need amaryl  #hyperlipidemia S: Medication:Lipitor 40 mg Lab Results  Component Value Date   CHOL 223 (H) 08/22/2019   HDL 41 08/22/2019   LDLCALC 150 (H) 08/22/2019   LDLDIRECT 133 (H) 07/19/2018   TRIG 186 (H) 08/22/2019   CHOLHDL 5.4 (H) 08/22/2019   A/P: Hopefully improve-update lipid panel today-discussed rosuvastatin 40 mg if LDL above 100   #hypertension S: medication: Zestoretic 20-25 mg BP Readings from Last 3 Encounters:  09/21/20 138/77  01/05/20 122/80  08/22/19 122/70  A/P: High normal but still reasonable control-continue current medication and work on lifestyle changes   #Migraines-sparing flurbiprofen.  Willing to refill if needed.  Previously filled by Dr. Melton Alar who has retired in recent years. Rarely using  #OSA-compliant with CPAP  #GERD- - he is willing to try 20mg  instead of 40mg  nexium- will send this in.  - check b12 levels with labs. Taking an immune pill  that has b12  Recommended follow up:  Change next visit to follow up  Future Appointments  Date Time Provider [place]  01/24/2021  2:40 PM Marin Olp, MD [facility]   Lab/Order associations:non fasting   ICD-10-CM   1. Preventative health care  Z00.00 CBC With Differential/Platelet    COMPLETE METABOLIC PANEL WITH GFR    Lipid Panel w/reflex Direct LDL    Hemoglobin A1c    PSA    POCT Urinalysis Dipstick (Automated)    Ambulatory Referral for Lung Cancer Scre    CANCELED: Vitamin B12  2. Controlled type 2 diabetes mellitus without complication, without long-term current use of insulin (HCC)  E11.9 CBC With Differential/Platelet    COMPLETE METABOLIC PANEL WITH GFR    Lipid Panel w/reflex Direct LDL    Hemoglobin A1c  3. Hypertension associated with diabetes ([place])  E11.59    I15.2   4. Hyperlipidemia associated with type 2 diabetes mellitus ([place])  E11.69    E78.5   5. Migraine with aura and without status migrainosus, not intractable  G43.109   6. Sleep apnea, unspecified type  G47.30   7. Former smoker  Z87.891 POCT Urinalysis Dipstick (Automated)    Ambulatory Referral for Lung Cancer Scre  8. Screening for lung cancer  Z12.2 Ambulatory Referral for Lung Cancer Scre  9. High risk medication use  Z79.899 Vitamin B12    CANCELED: Vitamin B12  10. Screening for prostate cancer  Z12.5 PSA    Meds ordered this encounter  Medications  . esomeprazole (NEXIUM) 20 MG capsule    Sig: Take 1 capsule (20 mg total) by mouth daily.    Dispense:  90 capsule    Refill:  3    Return precautions advised.  Garret Reddish, MD

## 2020-09-21 NOTE — Patient Instructions (Addendum)
Health Maintenance Due  Topic Date Due  . OPHTHALMOLOGY EXAM - please call to schedule diabetic eye exam 12/24/2019   Please stop by lab before you go If you have mychart- we will send your results within 3 business days of [place] receiving them.  If you do not have mychart- we will call you about results within 5 business days of [place] receiving them.  *please note we are currently using [facility] which has a longer processing time than [place] typically so labs may not come back as quickly as in the past *please also note that you will see labs on mychart as soon as they post. I will later go in and write notes on them- will say "notes from Dr. Yong Channel"  Stay on metformin twice daily. We may have to do glimepiride again and definitely have to get back on the healthy eating and glad you are checking out the [facility] today!   Recommended follow up: change march visit at check out to regular visit since we did physical today

## 2020-09-23 ENCOUNTER — Other Ambulatory Visit: Payer: Self-pay

## 2020-09-23 MED ORDER — ROSUVASTATIN CALCIUM 40 MG PO TABS: 40.0000 mg | ORAL_TABLET | Freq: Every day | ORAL | 3 refills | Status: DC

## 2020-09-24 LAB — COMPLETE METABOLIC PANEL WITH GFR
AG Ratio: 1.4 (calc) (ref 1.0–2.5)
ALT: 39 U/L (ref 9–46)
AST: 32 U/L (ref 10–35)
Albumin: 4.6 g/dL (ref 3.6–5.1)
Alkaline phosphatase (APISO): 55 U/L (ref 35–144)
BUN: 14 mg/dL (ref 7–25)
CO2: 28 mmol/L (ref 20–32)
Calcium: 10.2 mg/dL (ref 8.6–10.3)
Chloride: 99 mmol/L (ref 98–110)
Creat: 1.25 mg/dL (ref 0.70–1.33)
GFR, Est African American: 76 mL/min/{1.73_m2} (ref >=60)
GFR, Est Non African American: 66 mL/min/{1.73_m2} (ref >=60)
Globulin: 3.4 g/dL (calc) (ref 1.9–3.7)
Glucose, Bld: 84 mg/dL (ref 65–99)
Potassium: 3.9 mmol/L (ref 3.5–5.3)
Sodium: 136 mmol/L (ref 135–146)
Total Bilirubin: 0.8 mg/dL (ref 0.2–1.2)
Total Protein: 8 g/dL (ref 6.1–8.1)

## 2020-09-24 LAB — CBC WITH DIFFERENTIAL/PLATELET
Absolute Monocytes: 646 cells/uL (ref 200–950)
Basophils Absolute: 68 cells/uL (ref 0–200)
Basophils Relative: 0.9 %
Eosinophils Absolute: 213 cells/uL (ref 15–500)
Eosinophils Relative: 2.8 %
HCT: 47.9 % (ref 38.5–50.0)
Hemoglobin: 16.3 g/dL (ref 13.2–17.1)
Lymphs Abs: 2789 cells/uL (ref 850–3900)
MCH: 29.6 pg (ref 27.0–33.0)
MCHC: 34 g/dL (ref 32.0–36.0)
MCV: 86.9 fL (ref 80.0–100.0)
MPV: 9.5 fL (ref 7.5–12.5)
Monocytes Relative: 8.5 %
Neutro Abs: 3884 cells/uL (ref 1500–7800)
Neutrophils Relative %: 51.1 %
Platelets: 312 10*3/uL (ref 140–400)
RBC: 5.51 10*6/uL (ref 4.20–5.80)
RDW: 13.5 % (ref 11.0–15.0)
Total Lymphocyte: 36.7 %
WBC: 7.6 10*3/uL (ref 3.8–10.8)

## 2020-09-24 LAB — HEMOGLOBIN A1C
Hgb A1c MFr Bld: 7.6 % of total Hgb — ABNORMAL HIGH (ref <5.7)
Mean Plasma Glucose: 171 (calc)
eAG (mmol/L): 9.5 (calc)

## 2020-09-24 LAB — VITAMIN B12: Vitamin B-12: 567 pg/mL (ref 200–1100)

## 2020-09-24 LAB — LIPID PANEL W/REFLEX DIRECT LDL
Cholesterol: 331 mg/dL — ABNORMAL HIGH (ref <200)
HDL: 43 mg/dL (ref >=40)
Non-HDL Cholesterol (Calc): 288 mg/dL (calc) — ABNORMAL HIGH (ref <130)
Total CHOL/HDL Ratio: 7.7 (calc) — ABNORMAL HIGH (ref <5.0)
Triglycerides: 421 mg/dL — ABNORMAL HIGH (ref <150)

## 2020-09-24 LAB — DIRECT LDL: Direct LDL: 209 mg/dL — ABNORMAL HIGH (ref <100)

## 2020-09-24 LAB — PSA: PSA: 0.93 ng/mL (ref <=4.0)

## 2020-10-20 ENCOUNTER — Other Ambulatory Visit: Payer: Self-pay

## 2020-10-20 ENCOUNTER — Encounter: Payer: Self-pay | Admitting: Family Medicine

## 2020-10-20 ENCOUNTER — Ambulatory Visit: Payer: Self-pay | Admitting: Family Medicine

## 2020-10-20 ENCOUNTER — Other Ambulatory Visit (HOSPITAL_COMMUNITY)
Admission: RE | Admit: 2020-10-20 | Discharge: 2020-10-20 | Disposition: A | Discharge status: Home / Self Care | Payer: Self-pay | Source: Ambulatory Visit | Attending: Family Medicine | Admitting: Family Medicine

## 2020-10-20 VITALS — BP 135/86 | HR 79 | Temp 98.7°F | Ht 76.0 in | Wt 252.4 lb

## 2020-10-20 DIAGNOSIS — N5089 Other specified disorders of the male genital organs: Secondary | ICD-10-CM | POA: Insufficient documentation

## 2020-10-20 DIAGNOSIS — N451 Epididymitis: Secondary | ICD-10-CM

## 2020-10-20 DIAGNOSIS — N452 Orchitis: Secondary | ICD-10-CM

## 2020-10-20 LAB — POC URINALSYSI DIPSTICK (AUTOMATED)
Bilirubin, UA: 1
Blood, UA: NEGATIVE
Glucose, UA: NEGATIVE
Ketones, UA: NEGATIVE
Nitrite, UA: NEGATIVE
Protein, UA: POSITIVE — AB
Spec Grav, UA: 1.015 (ref 1.010–1.025)
Urobilinogen, UA: 2 E.U./dL — AB
pH, UA: 7 (ref 5.0–8.0)

## 2020-10-20 MED ORDER — LEVOFLOXACIN 500 MG PO TABS: 500.0000 mg | ORAL_TABLET | Freq: Every day | ORAL | 0 refills | Status: DC

## 2020-10-20 NOTE — Progress Notes (Signed)
Phone [phone] In person visit   Subjective:   Trevor Flowers is a 54 y.o. year old very pleasant male patient who presents for/with See problem oriented charting Chief Complaint  Patient presents with  . Groin Pain    R Side, x2 Weeks, aleve helps with the pain     This visit occurred during the SARS-CoV-2 public health emergency.  Safety protocols were in place, including screening questions prior to the visit, additional usage of staff PPE, and extensive cleaning of exam room while observing appropriate contact time as indicated for disinfecting solutions.   Past Medical History-  Patient Active Problem List   Diagnosis Date Noted  . Diabetes mellitus type II, controlled ([place]) 06/09/2016    Priority: High  . History of adenomatous polyp of colon 03/20/2018    Priority: Medium  . Hypertension associated with diabetes ([place]) 11/13/2010    Priority: Medium  . Sleep apnea 01/07/2009    Priority: Medium  . Hyperlipidemia associated with type 2 diabetes mellitus ([place]) 01/08/2008    Priority: Medium  . Migraine 12/04/2007    Priority: Medium  . Former smoker 06/03/2015    Priority: Low  . Allergic rhinitis 12/24/2014    Priority: Low  . GERD (gastroesophageal reflux disease) 11/14/1999    Priority: Low  . Quadriceps tendinitis 11/17/2015    Medications- reviewed and updated Current Outpatient Medications  Medication Sig Dispense Refill  . atorvastatin (LIPITOR) 20 MG tablet Take 20 mg by mouth daily.    Marland Kitchen esomeprazole (NEXIUM) 20 MG capsule Take 1 capsule (20 mg total) by mouth daily. 90 capsule 3  . flurbiprofen (ANSAID) 100 MG tablet Take 0.5 tablets (50 mg total) by mouth 2 (two) times daily as needed (migraines). 30 tablet 3  . glimepiride (AMARYL) 2 MG tablet Take by mouth.    Marland Kitchen lisinopril-hydrochlorothiazide (ZESTORETIC) 20-25 MG tablet TAKE 1 TABLET BY MOUTH EVERY DAY 90 tablet 1  . loratadine (CLARITIN) 10 MG tablet Take 10 mg by mouth daily.    . metFORMIN  (GLUCOPHAGE) 1000 MG tablet TAKE 1 TABLET BY MOUTH TWICE A DAY WITH FOOD 60 tablet 5  . rosuvastatin (CRESTOR) 40 MG tablet Take 1 tablet (40 mg total) by mouth daily. 90 tablet 3  . levofloxacin (LEVAQUIN) 500 MG tablet Take 1 tablet (500 mg total) by mouth daily. 10 tablet 0   No current facility-administered medications for this visit.     Objective:  BP 135/86   Pulse 79   Temp 98.7 F (37.1 C) (Temporal)   Ht 6\' 4"  (1.93 m)   Wt 252 lb 6.4 oz (114.5 kg)   SpO2 99%   BMI 30.72 kg/m  Gen: NAD, resting comfortably CV: RRR no murmurs rubs or gallops Lungs: CTAB no crackles, wheeze, rhonchi Abdomen: soft/nontender/nondistended/normal bowel sounds. No rebound or guarding.  Ext: no edema Skin: warm, dry Neuro: grossly normal, moves all extremities GU: Left testicle normal and nontender approximately 5 x 3 cm.  Right testicle significantly swollen approximately 8 x 5 cm.  Denies significant pain but minimal diffuse tenderness  No obvious hernia noted.    Assessment and Plan  # Right Groin Pain S: symptoms started 2 weeks ago. Started hurting in right testicle a few hours after masturbating in the shower. A few days later symptoms worsened. Ibuprofen helped his symptoms when he took it. Symptoms would come back when medicine wore off. He has also noted some swelling. Worse with getting up or moving around. Aleve helps with pain  but still having swelling. No fall or injury. Still active getting up in the truck- hurts worse hen he does that. Up to moderate level pain. Sometimes pain will go away even without meds. Last dose of med last night- nothing today and still feels ok- very mild.  Last sexually active 3 months ago but only active with GF. He has not cheated and not concerned about infidelity on her side.   A/P:    possible epididymoorchitis (infection/swelling of testicle and epididymis which sits on top of the testicle).  Recommended ultrasound-declined as below. -We will get  urinalysis and urine culture -Low risk for STDs but we will check for gonorrhea and chlamydia which are common causes of this. -Since low risk for STDs we opted to treat with Levaquin 500 mg daily-warned of potential side effects.  10 days of treatment. -I strongly recommended scrotum ultrasound but patient is very concerned about cost- patient knows we could be missing something like torsion or mass.  He prefers to wait on this if symptoms fail to improve on antibiotic.  If he has worsening symptoms should seek care in emergency room.  Recommended follow up: No follow-ups on file. Future Appointments  Date Time Provider [place]  01/24/2021  2:40 PM Marin Olp, MD [facility]    Lab/Order associations:   ICD-10-CM   1. Testicular swelling, right  N50.89 Urine cytology ancillary only    Urine Culture    POCT Urinalysis Dipstick (Automated)  2. Orchitis of right testicle  N45.2   3. Right epididymitis  N45.1    Extended counseling reviewing potential side effects of from up to date Meds ordered this encounter  Medications  . levofloxacin (LEVAQUIN) 500 MG tablet    Sig: Take 1 tablet (500 mg total) by mouth daily.    Dispense:  10 tablet    Refill:  0   Time Spent: 32 minutes of total time (11:24 AM- 11:56 AM) was spent on the date of the encounter performing the following actions: chart review prior to seeing the patient, obtaining history, performing a medically necessary exam, counseling on the treatment plan, placing orders, and documenting in our EHR.   Return precautions advised.  Garret Reddish, MD

## 2020-10-20 NOTE — Patient Instructions (Addendum)
Health Maintenance Due  Topic Date Due  . OPHTHALMOLOGY EXAM  Pt states that he hasn't had one and isn't sure if he will get one.Marland Kitchen 12/24/2019   possible epididymoorchitis (infection/swelling of testicle and epididymis which sits on top of the testicle).  Recommended ultrasound-declined as below. -We will get urinalysis and urine culture -Low risk for STDs but we will check for gonorrhea and chlamydia which are common causes of this. -Since low risk for STDs we opted to treat with Levaquin 500 mg daily-warned of potential side effects.  10 days of treatment.   -I recommended scrotum ultrasound but patient is very concerned about cost.  He prefers to wait on this if symptoms fail to improve on antibiotic.  If he has worsening symptoms should seek care in emergency room. If you change your mind on ultrasound please let me know ASAP  Please stop by lab before you go If you have mychart- we will send your results within 3 business days of [place] receiving them.  If you do not have mychart- we will call you about results within 5 business days of [place] receiving them.  *please note we are currently using [facility] which has a longer processing time than [place] typically so labs may not come back as quickly as in the past *please also note that you will see labs on mychart as soon as they post. I will later go in and write notes on them- will say "notes from Dr. Yong Channel"

## 2020-10-21 LAB — URINE CYTOLOGY ANCILLARY ONLY
Chlamydia: NEGATIVE
Comment: NEGATIVE
Comment: NEGATIVE
Comment: NORMAL
Neisseria Gonorrhea: NEGATIVE
Trichomonas: NEGATIVE

## 2020-10-21 LAB — URINE CULTURE
MICRO NUMBER:: 11292604
Result:: NO GROWTH
SPECIMEN QUALITY:: ADEQUATE

## 2020-11-21 ENCOUNTER — Other Ambulatory Visit: Payer: Self-pay | Admitting: Family Medicine

## 2021-01-23 NOTE — Patient Instructions (Incomplete)
Please stop by lab before you go If you have mychart- we will send your results within 3 business days of [place] receiving them.  If you do not have mychart- we will call you about results within 5 business days of [place] receiving them.  *please also note that you will see labs on mychart as soon as they post. I will later go in and write notes on them- will say "notes from Dr. Yong Channel"  Diabetes Health Maintenance Due  Topic Date Due  . OPHTHALMOLOGY EXAM  12/24/2019  . FOOT EXAM  01/04/2021   Depression screen [facility] 2/9 09/21/2020 01/05/2020 08/22/2019  Decreased Interest 0 0 1  Down, Depressed, Hopeless 0 0 0  PHQ - 2 Score 0 0 1

## 2021-01-23 NOTE — Progress Notes (Deleted)
Phone: [phone]   Subjective:  Patient presents today for their annual physical. Chief complaint-noted.   See problem oriented charting- ROS- full  review of systems was completed and negative  except for: ***  The following were reviewed and entered/updated in epic: Past Medical History:  Diagnosis Date  . Allergy   . GERD (gastroesophageal reflux disease)   . HYPERLIPIDEMIA 01/08/2008  . Hypertension   . Migraine 12/04/2007  . Sleep apnea    uses cpap  . TRANSAMINASES, SERUM, ELEVATED 01/08/2008   resolved on repeat   Patient Active Problem List   Diagnosis Date Noted  . History of adenomatous polyp of colon 03/20/2018  . Diabetes mellitus type II, controlled ([place]) 06/09/2016  . Quadriceps tendinitis 11/17/2015  . Former smoker 06/03/2015  . Allergic rhinitis 12/24/2014  . Hypertension associated with diabetes ([place]) 11/13/2010  . Sleep apnea 01/07/2009  . Hyperlipidemia associated with type 2 diabetes mellitus ([place]) 01/08/2008  . Migraine 12/04/2007  . GERD (gastroesophageal reflux disease) 11/14/1999   Past Surgical History:  Procedure Laterality Date  . none      Family History  Problem Relation Age of Onset  . Hypertension Mother   . Other Father        unknown history  . Asthma Sister   . Colon cancer Neg Hx   . Colon polyps Neg Hx   . Esophageal cancer Neg Hx   . Rectal cancer Neg Hx   . Stomach cancer Neg Hx     Medications- reviewed and updated Current Outpatient Medications  Medication Sig Dispense Refill  . atorvastatin (LIPITOR) 20 MG tablet Take 20 mg by mouth daily.    Marland Kitchen esomeprazole (NEXIUM) 20 MG capsule Take 1 capsule (20 mg total) by mouth daily. 90 capsule 3  . flurbiprofen (ANSAID) 100 MG tablet Take 0.5 tablets (50 mg total) by mouth 2 (two) times daily as needed (migraines). 30 tablet 3  . glimepiride (AMARYL) 2 MG tablet Take by mouth.    . levofloxacin (LEVAQUIN) 500 MG tablet Take 1 tablet (500 mg total) by mouth daily. 10 tablet  0  . lisinopril-hydrochlorothiazide (ZESTORETIC) 20-25 MG tablet TAKE 1 TABLET BY MOUTH EVERY DAY 90 tablet 1  . loratadine (CLARITIN) 10 MG tablet Take 10 mg by mouth daily.    . metFORMIN (GLUCOPHAGE) 1000 MG tablet TAKE 1 TABLET BY MOUTH TWICE A DAY WITH FOOD 60 tablet 5  . rosuvastatin (CRESTOR) 40 MG tablet Take 1 tablet (40 mg total) by mouth daily. 90 tablet 3   No current facility-administered medications for this visit.    Allergies-reviewed and updated Allergies  Allergen Reactions  . Mold Extract [Trichophyton]     Social History   Social History Narrative   Divorced since 2017 on drugs. monogamous with GF. Son and daughter 26 and 27 in 2021.       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 Gen: NAD, resting comfortably HEENT: Mucous membranes are moist. Oropharynx normal Neck: no thyromegaly CV: RRR no murmurs rubs or gallops Lungs: CTAB no crackles, wheeze, rhonchi Abdomen: soft/nontender/nondistended/normal bowel sounds. No rebound or guarding.  Ext: no edema Skin: warm, dry Neuro: grossly normal, moves all extremities, PERRLA ***   Assessment and Plan  55 y.o. male presenting for annual physical.  Health Maintenance counseling: 1. Anticipatory guidance: Patient counseled regarding regular dental exams ***q6 months, eye exams ***yearly,  avoiding smoking and second hand smoke*** , limiting alcohol to 2 beverages per day ***.   2. Risk factor reduction:  Advised patient of need for regular exercise and diet rich and fruits and vegetables to reduce risk of heart attack and stroke. Exercise- ***. Diet-***.  Wt Readings from Last 3 Encounters:  10/20/20 252 lb 6.4 oz (114.5 kg)  09/21/20 255 lb (115.7 kg)  01/05/20 252 lb 6.4 oz (114.5 kg)   3.  Immunizations/screenings/ancillary studies Immunization History  Administered Date(s) Administered  . Moderna Sars-Covid-2 Vaccination 05/27/2020, 07/08/2020  . Pneumococcal Polysaccharide-23 07/19/2018  . Td 12/15/2007  . Tdap 01/08/2008, 04/12/2018   Health Maintenance Due  Topic Date Due  . OPHTHALMOLOGY EXAM  12/24/2019  . COVID-19 Vaccine (3 - Booster for Moderna series) 01/08/2021  . FOOT EXAM  01/04/2021   4. Prostate cancer screening- ***  Lab Results  Component Value Date   PSA 0.93 09/21/2020   PSA 1.17 12/31/2017   PSA 0.99 06/01/2016   5. Colon cancer screening - *** 6. Skin cancer screening- ***advised regular sunscreen use. Denies worrisome, changing, or new skin lesions.  7. FORMER smoker 8. STD screening - ***  Status of chronic or acute concerns  #hyperlipidemia S: Medication: Rosuvastatin 40Mg , Atorvastatin 20Mg  Lab Results  Component Value Date   CHOL 331 (H) 09/21/2020   HDL 43 09/21/2020   [place]  09/21/2020     Comment:     . LDL cholesterol not calculated. Triglyceride levels greater than 400 mg/dL invalidate calculated LDL results. . Reference range: <100 . Desirable range <100 mg/dL for primary prevention;   <70 mg/dL for patients with CHD or diabetic patients  with > or = 2 CHD risk factors. Marland Kitchen LDL-C is now calculated using the Martin-Hopkins  calculation, which is a validated novel method providing  better accuracy than the Friedewald equation in the  estimation of LDL-C.  Cresenciano Genre et al. Annamaria Helling. 7262;035(59): 2061-2068  (http://education.[facility].com/faq/FAQ164)    LDLDIRECT 209 (H) 09/21/2020   TRIG 421 (H) 09/21/2020   CHOLHDL 7.7 (H) 09/21/2020   A/P: ***  # Diabetes S: Medication: Metformin 1000Mg  CBGs- *** Exercise and diet- *** Lab Results  Component Value Date   HGBA1C 7.6 (H) 09/21/2020   HGBA1C 5.9 (A) 01/05/2020   HGBA1C 10.5 (A) 08/22/2019    A/P: ***  # GERD S:Medication: Nexium 20Mg  B12 levels related  to PPI use: Lab Results  Component Value Date   VITAMINB12 567 09/21/2020   A/P: ***   #hypertension S: medication: Zestoretic 20- Home readings #s: *** BP Readings from Last 3 Encounters:  10/20/20 135/86  09/21/20 138/77  01/05/20 122/80  A/P: ***   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  Recommended follow up: ***No follow-ups on file. Future Appointments  Date Time Provider [place]  01/24/2021  2:40 PM Marin Olp, MD [facility]    No chief complaint on file.  Lab/Order associations:*** fasting No diagnosis found.  No orders of the defined types were placed in this encounter.   Return precautions advised.  Clyde Lundborg, CMA

## 2021-01-24 ENCOUNTER — Encounter: Payer: Self-pay | Admitting: Family Medicine

## 2021-01-24 DIAGNOSIS — K219 Gastro-esophageal reflux disease without esophagitis: Secondary | ICD-10-CM

## 2021-01-24 DIAGNOSIS — E1159 Type 2 diabetes mellitus with other circulatory complications: Secondary | ICD-10-CM

## 2021-01-24 DIAGNOSIS — E1169 Type 2 diabetes mellitus with other specified complication: Secondary | ICD-10-CM

## 2021-01-24 DIAGNOSIS — Z0000 Encounter for general adult medical examination without abnormal findings: Secondary | ICD-10-CM

## 2021-01-24 DIAGNOSIS — Z87891 Personal history of nicotine dependence: Secondary | ICD-10-CM

## 2021-01-24 DIAGNOSIS — E119 Type 2 diabetes mellitus without complications: Secondary | ICD-10-CM

## 2021-05-19 ENCOUNTER — Other Ambulatory Visit: Payer: Self-pay | Admitting: Family Medicine

## 2021-07-05 ENCOUNTER — Other Ambulatory Visit: Payer: Self-pay

## 2021-07-05 ENCOUNTER — Encounter: Payer: Self-pay | Admitting: Family Medicine

## 2021-07-05 ENCOUNTER — Ambulatory Visit (INDEPENDENT_AMBULATORY_CARE_PROVIDER_SITE_OTHER): Payer: BC Managed Care – PPO | Admitting: Family Medicine

## 2021-07-05 VITALS — BP 129/80 | HR 71 | Temp 98.2°F | Ht 76.0 in | Wt 246.8 lb

## 2021-07-05 DIAGNOSIS — E1169 Type 2 diabetes mellitus with other specified complication: Secondary | ICD-10-CM

## 2021-07-05 DIAGNOSIS — I152 Hypertension secondary to endocrine disorders: Secondary | ICD-10-CM | POA: Diagnosis not present

## 2021-07-05 DIAGNOSIS — K219 Gastro-esophageal reflux disease without esophagitis: Secondary | ICD-10-CM

## 2021-07-05 DIAGNOSIS — E119 Type 2 diabetes mellitus without complications: Secondary | ICD-10-CM | POA: Diagnosis not present

## 2021-07-05 DIAGNOSIS — E785 Hyperlipidemia, unspecified: Secondary | ICD-10-CM

## 2021-07-05 DIAGNOSIS — E1159 Type 2 diabetes mellitus with other circulatory complications: Secondary | ICD-10-CM

## 2021-07-05 MED ORDER — ESOMEPRAZOLE MAGNESIUM 20 MG PO CPDR: 20.0000 mg | DELAYED_RELEASE_CAPSULE | Freq: Every day | ORAL | 3 refills | Status: DC

## 2021-07-05 NOTE — Patient Instructions (Addendum)
Health Maintenance Due  Topic Date Due   OPHTHALMOLOGY EXAM We will call you within two weeks about your referral to ophthalmology. If you do not hear within 2 weeks, give [place] a call.  12/24/2019   Please stop by lab before you go If you have mychart- we will send your results within 3 business days of [place] receiving them.  If you do not have mychart- we will call you about results within 5 business days of [place] receiving them.  *please also note that you will see labs on mychart as soon as they post. I will later go in and write notes on them- will say "notes from Dr. Yong Channel"  I am happy to see that you are down by 6 lbs today- keep up the good work!  I am thrilled to hear that you have worked on you lifestyle changes as far as your exercise and diet - please continue the great work!  Recommended follow up: Return in about 6 months (around 01/05/2022) for a physical as long as A1c if under 7.  Please schedule before you leave

## 2021-07-05 NOTE — Progress Notes (Signed)
Phone [phone] In person visit   Subjective:   Trevor Flowers is a 55 y.o. year old very pleasant male patient who presents for/with See problem oriented charting Chief Complaint  Patient presents with   Diabetes    This visit occurred during the SARS-CoV-2 public health emergency.  Safety protocols were in place, including screening questions prior to the visit, additional usage of staff PPE, and extensive cleaning of exam room while observing appropriate contact time as indicated for disinfecting solutions.   Past Medical History-  Patient Active Problem List   Diagnosis Date Noted   Diabetes mellitus type II, controlled ([place]) 06/09/2016    Priority: High   History of adenomatous polyp of colon 03/20/2018    Priority: Medium   Hypertension associated with diabetes ([place]) 11/13/2010    Priority: Medium   Sleep apnea 01/07/2009    Priority: Medium   Hyperlipidemia associated with type 2 diabetes mellitus ([place]) 01/08/2008    Priority: Medium   Migraine 12/04/2007    Priority: Medium   Former smoker 06/03/2015    Priority: Low   Allergic rhinitis 12/24/2014    Priority: Low   GERD (gastroesophageal reflux disease) 11/14/1999    Priority: Low   Quadriceps tendinitis 11/17/2015    Medications- reviewed and updated Current Outpatient Medications  Medication Sig Dispense Refill   flurbiprofen (ANSAID) 100 MG tablet Take 0.5 tablets (50 mg total) by mouth 2 (two) times daily as needed (migraines). 30 tablet 3   lisinopril-hydrochlorothiazide (ZESTORETIC) 20-25 MG tablet TAKE 1 TABLET BY MOUTH EVERY DAY 90 tablet 1   loratadine (CLARITIN) 10 MG tablet Take 10 mg by mouth daily.     metFORMIN (GLUCOPHAGE) 1000 MG tablet TAKE 1 TABLET BY MOUTH TWICE A DAY WITH FOOD 180 tablet 1   rosuvastatin (CRESTOR) 40 MG tablet Take 1 tablet (40 mg total) by mouth daily. 90 tablet 3   esomeprazole (NEXIUM) 20 MG capsule Take 1 capsule (20 mg total) by mouth daily. 90 capsule 3   No  current facility-administered medications for this visit.     Objective:  BP 129/80   Pulse 71   Temp 98.2 F (36.8 C) (Temporal)   Ht '6\' 4"'$  (1.93 m)   Wt 246 lb 12.8 oz (111.9 kg)   SpO2 96%   BMI 30.04 kg/m  Gen: NAD, resting comfortably CV: RRR no murmurs rubs or gallops Lungs: CTAB no crackles, wheeze, rhonchi Ext: no edema Skin: warm, dry  Diabetic Foot Exam - Simple   Simple Foot Form Diabetic Foot exam was performed with the following findings: Yes 07/05/2021  3:24 PM  Visual Inspection No deformities, no ulcerations, no other skin breakdown bilaterally: Yes Sensation Testing Intact to touch and monofilament testing bilaterally: Yes Pulse Check Posterior Tibialis and Dorsalis pulse intact bilaterally: Yes Comments        Assessment and Plan   # Diabetes S: Medication:Metformin 1000 mg twice daily (more consistent from last visit) - discussed restarting glimepiride if sugars got above 125 - never had to do this- did occasionally take half tablet if poorer food choices CBGs- 108 this AM- has seen up to 120.  Exercise and diet- weight down 6 lbs- exercising with a lot of walking - plus active with work/dolly with some weight. Has improved diet at home- doing more fruits/peanuts helpful Lab Results  Component Value Date   HGBA1C 7.6 (H) 09/21/2020  A/P: hopefully improved- update a1c today. Continue current meds for now  #hyperlipidemia-trig over 400- prefer fasting  labs S: Medication: rosuvastatin 40 mg daily  A/P: we will check direct LDL to get more info but suspect improved with stronger dose and better compliance  #hypertension S: medication: Zestoretic 20-25 mg BP Readings from Last 3 Encounters:  07/05/21 129/80  10/20/20 135/86  09/21/20 138/77  A/P: Stable. Continue current medications.   # GERD S:Medication:  Nexium 20 mg occasionally and an immune pill that has B-12 in it A/P: reasonable control- continue current meds   #Migraines-sparing  flurbiprofen.  Willing to refill if needed- doesn't need for now.  In the past was filled by Dr. Melton Alar who has retired in recent years. Rarely using    #OSA-compliant with CPAP  Recommended follow up: 6 months CPE as long as a1c under 7  Lab/Order associations:NOT fasting   ICD-10-CM   1. Controlled type 2 diabetes mellitus without complication, without long-term current use of insulin (HCC)  E11.9 Comprehensive metabolic panel    HgB 123456    LDL cholesterol, direct    Ambulatory referral to Ophthalmology    2. Hypertension associated with diabetes ([place])  E11.59    I15.2     3. Hyperlipidemia associated with type 2 diabetes mellitus ([place])  E11.69    E78.5     4. Gastroesophageal reflux disease, unspecified whether esophagitis present  K21.9       Meds ordered this encounter  Medications   esomeprazole (NEXIUM) 20 MG capsule    Sig: Take 1 capsule (20 mg total) by mouth daily.    Dispense:  90 capsule    Refill:  3   I,Harris Phan,acting as a scribe for Garret Reddish, MD.,have documented all relevant documentation on the behalf of Garret Reddish, MD,as directed by  Garret Reddish, MD while in the presence of Garret Reddish, MD.  I, Garret Reddish, MD, have reviewed all documentation for this visit. The documentation on 07/05/21 for the exam, diagnosis, procedures, and orders are all accurate and complete.  Return precautions advised.  Garret Reddish, MD

## 2021-07-06 LAB — COMPREHENSIVE METABOLIC PANEL
ALT: 41 U/L (ref 0–53)
AST: 33 U/L (ref 0–37)
Albumin: 4.7 g/dL (ref 3.5–5.2)
Alkaline Phosphatase: 47 U/L (ref 39–117)
BUN: 16 mg/dL (ref 6–23)
CO2: 25 mEq/L (ref 19–32)
Calcium: 10.1 mg/dL (ref 8.4–10.5)
Chloride: 99 mEq/L (ref 96–112)
Creatinine, Ser: 1.24 mg/dL (ref 0.40–1.50)
GFR: 66.34 mL/min (ref >60.00)
Glucose, Bld: 84 mg/dL (ref 70–99)
Potassium: 4.1 mEq/L (ref 3.5–5.1)
Sodium: 137 mEq/L (ref 135–145)
Total Bilirubin: 0.9 mg/dL (ref 0.2–1.2)
Total Protein: 7.9 g/dL (ref 6.0–8.3)

## 2021-07-06 LAB — HEMOGLOBIN A1C: Hgb A1c MFr Bld: 6.9 % — ABNORMAL HIGH (ref 4.6–6.5)

## 2021-07-06 LAB — LDL CHOLESTEROL, DIRECT: Direct LDL: 91 mg/dL

## 2021-10-13 ENCOUNTER — Other Ambulatory Visit: Payer: Self-pay | Admitting: Family Medicine

## 2021-12-30 ENCOUNTER — Other Ambulatory Visit: Payer: Self-pay | Admitting: Family Medicine

## 2022-01-10 ENCOUNTER — Encounter: Payer: BC Managed Care – PPO | Admitting: Family Medicine

## 2022-01-13 NOTE — Progress Notes (Signed)
Phone: [phone]   Subjective:  Patient presents today for their annual physical. Chief complaint-noted.   See problem oriented charting- ROS- full  review of systems was completed and negative  except for: sinus pressure, sneezing, light sensitivity, joint pain, seasonal allergies  The following were reviewed and entered/updated in epic: Past Medical History:  Diagnosis Date   Allergy    GERD (gastroesophageal reflux disease)    HYPERLIPIDEMIA 01/08/2008   Hypertension    Migraine 12/04/2007   Sleep apnea    uses cpap   TRANSAMINASES, SERUM, ELEVATED 01/08/2008   resolved on repeat   Patient Active Problem List   Diagnosis Date Noted   Diabetes mellitus type II, controlled ([place]) 06/09/2016    Priority: High   History of adenomatous polyp of colon 03/20/2018    Priority: Medium    Hypertension associated with diabetes ([place]) 11/13/2010    Priority: Medium    Sleep apnea 01/07/2009    Priority: Medium    Hyperlipidemia associated with type 2 diabetes mellitus ([place]) 01/08/2008    Priority: Medium    Migraine 12/04/2007    Priority: Medium    Former smoker 06/03/2015    Priority: Low   Allergic rhinitis 12/24/2014    Priority: Low   GERD (gastroesophageal reflux disease) 11/14/1999    Priority: Low   Quadriceps tendinitis 11/17/2015   Past Surgical History:  Procedure Laterality Date   none      Family History  Problem Relation Age of Onset   Hypertension Mother    Other Father        unknown history   Asthma Sister    Colon cancer Neg Hx    Colon polyps Neg Hx    Esophageal cancer Neg Hx    Rectal cancer Neg Hx    Stomach cancer Neg Hx     Medications- reviewed and updated Current Outpatient Medications  Medication Sig Dispense Refill   esomeprazole (NEXIUM) 20 MG capsule Take 1 capsule (20 mg total) by mouth daily. 90 capsule 3   lisinopril-hydrochlorothiazide (ZESTORETIC) 20-25 MG tablet TAKE 1 TABLET BY MOUTH EVERY DAY 90 tablet 1   loratadine  (CLARITIN) 10 MG tablet Take 10 mg by mouth daily.     metFORMIN (GLUCOPHAGE) 1000 MG tablet TAKE 1 TABLET BY MOUTH TWICE A DAY WITH FOOD 180 tablet 1   rosuvastatin (CRESTOR) 40 MG tablet TAKE 1 TABLET BY MOUTH EVERY DAY 90 tablet 3   flurbiprofen (ANSAID) 100 MG tablet Take 0.5 tablets (50 mg total) by mouth 2 (two) times daily as needed (migraines). 30 tablet 3   No current facility-administered medications for this visit.    Allergies-reviewed and updated Allergies  Allergen Reactions   Mold Extract [Trichophyton]     Social History   Social History Narrative   Divorced since 2017 - ex was on drugs. monogamous with GF. Son and daughter 30 and 66 in 2021.       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 Oropharynx normal Neck:  no thyromegaly CV: RRR no murmurs rubs or gallops Lungs: CTAB no crackles, wheeze, rhonchi Abdomen: soft/nontender/nondistended/normal bowel sounds. No rebound or guarding.  Ext: no edema Skin: warm, dry Neuro: grossly normal, moves all extremities, PERRLA    Assessment and Plan  56 y.o. male presenting for annual physical.  Health Maintenance counseling: 1. Anticipatory guidance: Patient counseled regarding regular dental exams -q6 months, eye exams -yearly encouraged- has had ut we will try to get records  avoiding smoking and second hand smoke , limiting alcohol to 2 beverages per day, no illicit drugs.   2. Risk factor reduction:  Advised patient of need for regular exercise and diet rich and fruits and vegetables to reduce risk of heart attack and stroke.  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 Tough to eat well in the truck  Wt Readings from Last 3 Encounters:  01/23/22 254 lb 8 oz (115.4 kg)  07/05/21 246 lb 12.8 oz (111.9 kg)  10/20/20 252 lb 6.4 oz (114.5 kg)  3. Immunizations/screenings/ancillary studies-declines flu shot for season, wants to hold off on covid shots for now Immunization History  Administered Date(s) Administered   Moderna Sars-Covid-2 Vaccination 05/27/2020, 07/08/2020   Pneumococcal Polysaccharide-23 07/19/2018   Td 12/15/2007   Tdap 01/08/2008, 04/12/2018  4. Prostate cancer screening- low risk prior PSA trend-we will trend labs today with PSA. Opts out of rectal  Lab Results  Component Value Date   PSA 0.93 09/21/2020   PSA 1.17 12/31/2017   PSA 0.99 06/01/2016   5. Colon cancer screening - 03/14/2018 with plan for 5 year repeat-will be due next year 6. Skin cancer screening-low risk due to melanin content. advised regular sunscreen use. Denies worrisome, changing, or new skin lesions.  7. Smoking associated screening (lung cancer screening, AAA screen 65-75, UA)- former smoker- 22 pack years and quit in 2008.  Just outside of 15 years now-no longer qualifies for lung cancer screening program-appears did not get completed last year  as hoped.  We will check urinalysis today. 8. STD screening - opts out as monogamous- no concern infidelity  Status of chronic or acute concerns   # Diabetes S: Medication:Metformin 1000 mg twice daily - discussed restarting glimepiride if sugars got above 125 or if a1c over 7 CBGs- 90s to 110s, highest 130 Lab Results  Component Value Date   HGBA1C 6.9 (H) 07/05/2021   HGBA1C 7.6 (H) 09/21/2020   HGBA1C 5.9 (A) 01/05/2020   A/P: Reasonable control last check-update A1c today and hopefully continue current medication/same plan -Needs updated diabetic eye exam  copy on file- already had this- ROI   #hyperlipidemia-trig over 400- prefer fasting labs S: Medication: rosuvastatin 40 mg daily Lab Results  Component Value Date   CHOL 331 (H) 09/21/2020   HDL 43 09/21/2020   [place]  09/21/2020     Comment:     . LDL cholesterol not calculated. Triglyceride levels greater than 400 mg/dL invalidate calculated LDL results. . Reference range: <100 . Desirable range <100 mg/dL for primary prevention;   <70 mg/dL for patients with CHD or diabetic patients  with > or = 2 CHD risk factors. Marland Kitchen LDL-C is now calculated using the Martin-Hopkins  calculation, which is a validated novel method providing  better accuracy than the Friedewald equation in the  estimation of LDL-C.  Cresenciano Genre et al. Annamaria Helling.  7062;376(28): 2061-2068  (http://education.[facility].com/faq/FAQ164)    LDLDIRECT 91.0 07/05/2021   TRIG 421 (H) 09/21/2020   CHOLHDL 7.7 (H) 09/21/2020   A/P: We discussed with high triglycerides and want him to come back fasting-update lipid panel when he comes back-for now continue current medication  #hypertension S: medication: Zestoretic 20-25 mg BP Readings from Last 3 Encounters:  01/23/22 120/70  07/05/21 129/80  10/20/20 135/86  A/P:  Controlled. Continue current medications.    # GERD S:Medication:  Nexium 20 mg and an immune pill that has B-12 in it - does well as long as avoids pastas A/P: reasonable control- continue current meds   #Migraines-sparing flurbiprofen- once a month. In the past was filled by Dr. Melton Alar who has retired in recent years. Refill today   #OSA-compliant with CPAP for most part- encouraged not missing  Recommended follow up: Return in about 6 months (around 07/26/2022) for follow up- or sooner if needed (as long as a1c under 7).  Lab/Order associations: fasting   ICD-10-CM   1. Preventative health care  Z00.00 CBC with Differential/Platelet    Comprehensive metabolic panel    Lipid panel    HgB A1c     POCT Urinalysis Dipstick (Automated)    PSA    2. Controlled type 2 diabetes mellitus without complication, without long-term current use of insulin (HCC)  E11.9 CBC with Differential/Platelet    Comprehensive metabolic panel    Lipid panel    HgB A1c    POCT Urinalysis Dipstick (Automated)    3. Hyperlipidemia associated with type 2 diabetes mellitus ([place])  E11.69    E78.5     4. Essential hypertension  I10     5. Gastroesophageal reflux disease, unspecified whether esophagitis present  K21.9     6. Screening for prostate cancer  Z12.5 PSA      Meds ordered this encounter  Medications   flurbiprofen (ANSAID) 100 MG tablet    Sig: Take 0.5 tablets (50 mg total) by mouth 2 (two) times daily as needed (migraines).    Dispense:  30 tablet    Refill:  3    I,Jada Bradford,acting as a scribe for Garret Reddish, MD.,have documented all relevant documentation on the behalf of Garret Reddish, MD,as directed by  Garret Reddish, MD while in the presence of Garret Reddish, MD.  I, Garret Reddish, MD, have reviewed all documentation for this visit. The documentation on 01/23/22 for the exam, diagnosis, procedures, and orders are all accurate and complete.  Return precautions advised.  Garret Reddish, MD

## 2022-01-23 ENCOUNTER — Ambulatory Visit (INDEPENDENT_AMBULATORY_CARE_PROVIDER_SITE_OTHER): Payer: BC Managed Care – PPO | Admitting: Family Medicine

## 2022-01-23 ENCOUNTER — Encounter: Payer: Self-pay | Admitting: Family Medicine

## 2022-01-23 VITALS — BP 120/70 | HR 82 | Temp 97.8°F | Ht 76.0 in | Wt 254.5 lb

## 2022-01-23 DIAGNOSIS — E785 Hyperlipidemia, unspecified: Secondary | ICD-10-CM

## 2022-01-23 DIAGNOSIS — Z125 Encounter for screening for malignant neoplasm of prostate: Secondary | ICD-10-CM

## 2022-01-23 DIAGNOSIS — E1169 Type 2 diabetes mellitus with other specified complication: Secondary | ICD-10-CM | POA: Diagnosis not present

## 2022-01-23 DIAGNOSIS — K219 Gastro-esophageal reflux disease without esophagitis: Secondary | ICD-10-CM

## 2022-01-23 DIAGNOSIS — I10 Essential (primary) hypertension: Secondary | ICD-10-CM | POA: Diagnosis not present

## 2022-01-23 DIAGNOSIS — E119 Type 2 diabetes mellitus without complications: Secondary | ICD-10-CM

## 2022-01-23 DIAGNOSIS — Z0000 Encounter for general adult medical examination without abnormal findings: Secondary | ICD-10-CM

## 2022-01-23 LAB — COMPREHENSIVE METABOLIC PANEL
ALT: 40 U/L (ref 0–53)
AST: 28 U/L (ref 0–37)
Albumin: 4.6 g/dL (ref 3.5–5.2)
Alkaline Phosphatase: 40 U/L (ref 39–117)
BUN: 15 mg/dL (ref 6–23)
CO2: 27 mEq/L (ref 19–32)
Calcium: 9.7 mg/dL (ref 8.4–10.5)
Chloride: 103 mEq/L (ref 96–112)
Creatinine, Ser: 1.24 mg/dL (ref 0.40–1.50)
GFR: 66.08 mL/min (ref >60.00)
Glucose, Bld: 153 mg/dL — ABNORMAL HIGH (ref 70–99)
Potassium: 3.9 mEq/L (ref 3.5–5.1)
Sodium: 138 mEq/L (ref 135–145)
Total Bilirubin: 0.7 mg/dL (ref 0.2–1.2)
Total Protein: 7.1 g/dL (ref 6.0–8.3)

## 2022-01-23 LAB — CBC WITH DIFFERENTIAL/PLATELET
Basophils Absolute: 0 10*3/uL (ref 0.0–0.1)
Basophils Relative: 0.7 % (ref 0.0–3.0)
Eosinophils Absolute: 0.2 10*3/uL (ref 0.0–0.7)
Eosinophils Relative: 2.5 % (ref 0.0–5.0)
HCT: 41.8 % (ref 39.0–52.0)
Hemoglobin: 14.1 g/dL (ref 13.0–17.0)
Lymphocytes Relative: 35.1 % (ref 12.0–46.0)
Lymphs Abs: 2.1 10*3/uL (ref 0.7–4.0)
MCHC: 33.8 g/dL (ref 30.0–36.0)
MCV: 88.2 fl (ref 78.0–100.0)
Monocytes Absolute: 0.5 10*3/uL (ref 0.1–1.0)
Monocytes Relative: 7.6 % (ref 3.0–12.0)
Neutro Abs: 3.3 10*3/uL (ref 1.4–7.7)
Neutrophils Relative %: 54.1 % (ref 43.0–77.0)
Platelets: 306 10*3/uL (ref 150.0–400.0)
RBC: 4.74 Mil/uL (ref 4.22–5.81)
RDW: 14.2 % (ref 11.5–15.5)
WBC: 6.1 10*3/uL (ref 4.0–10.5)

## 2022-01-23 LAB — POC URINALSYSI DIPSTICK (AUTOMATED)
Bilirubin, UA: NEGATIVE
Blood, UA: NEGATIVE
Glucose, UA: NEGATIVE
Ketones, UA: NEGATIVE
Leukocytes, UA: NEGATIVE
Nitrite, UA: NEGATIVE
Protein, UA: NEGATIVE
Spec Grav, UA: 1.02 (ref 1.010–1.025)
Urobilinogen, UA: 1 E.U./dL
pH, UA: 6.5 (ref 5.0–8.0)

## 2022-01-23 LAB — LIPID PANEL
Cholesterol: 150 mg/dL (ref 0–200)
HDL: 39 mg/dL — ABNORMAL LOW (ref >39.00)
LDL Cholesterol: 73 mg/dL (ref 0–99)
NonHDL: 110.5
Total CHOL/HDL Ratio: 4
Triglycerides: 190 mg/dL — ABNORMAL HIGH (ref 0.0–149.0)
VLDL: 38 mg/dL (ref 0.0–40.0)

## 2022-01-23 LAB — PSA: PSA: 0.99 ng/mL (ref 0.10–4.00)

## 2022-01-23 LAB — HEMOGLOBIN A1C: Hgb A1c MFr Bld: 7.4 % — ABNORMAL HIGH (ref 4.6–6.5)

## 2022-01-23 MED ORDER — FLURBIPROFEN 100 MG PO TABS: 50.0000 mg | ORAL_TABLET | Freq: Two times a day (BID) | ORAL | 3 refills | Status: DC | PRN

## 2022-01-23 NOTE — Patient Instructions (Addendum)
Get diabetic eye exam scheduled and have them fax the records to [place] at [phone]. ? ?Schedule a lab visit at the check out desk within 2 weeks (do not take metformin that morning but do drink water). Return for future fasting labs meaning nothing but water after midnight please. Ok to take your medications with water.  ? ?Recommended follow up: Return in about 6 months (around 07/26/2022) for follow up- or sooner if needed (as long as a1c under 7).  ?

## 2022-07-15 ENCOUNTER — Other Ambulatory Visit: Payer: Self-pay | Admitting: Family Medicine

## 2022-07-28 ENCOUNTER — Ambulatory Visit: Payer: BC Managed Care – PPO | Admitting: Family Medicine

## 2022-07-30 ENCOUNTER — Other Ambulatory Visit: Payer: Self-pay | Admitting: Family Medicine

## 2022-07-31 ENCOUNTER — Encounter: Payer: Self-pay | Admitting: Family Medicine

## 2022-07-31 ENCOUNTER — Ambulatory Visit (INDEPENDENT_AMBULATORY_CARE_PROVIDER_SITE_OTHER): Payer: BC Managed Care – PPO | Admitting: Family Medicine

## 2022-07-31 VITALS — BP 112/70 | HR 70 | Temp 98.2°F | Ht 76.0 in | Wt 239.8 lb

## 2022-07-31 DIAGNOSIS — I10 Essential (primary) hypertension: Secondary | ICD-10-CM | POA: Diagnosis not present

## 2022-07-31 DIAGNOSIS — E119 Type 2 diabetes mellitus without complications: Secondary | ICD-10-CM | POA: Diagnosis not present

## 2022-07-31 LAB — HEMOGLOBIN A1C: Hgb A1c MFr Bld: 7.1 % — ABNORMAL HIGH (ref 4.6–6.5)

## 2022-07-31 LAB — MICROALBUMIN / CREATININE URINE RATIO
Creatinine,U: 132.9 mg/dL
Microalb Creat Ratio: 0.5 mg/g (ref 0.0–30.0)
Microalb, Ur: <0.7 mg/dL (ref 0.0–1.9)

## 2022-07-31 LAB — COMPREHENSIVE METABOLIC PANEL
ALT: 38 U/L (ref 0–53)
AST: 27 U/L (ref 0–37)
Albumin: 4.3 g/dL (ref 3.5–5.2)
Alkaline Phosphatase: 46 U/L (ref 39–117)
BUN: 14 mg/dL (ref 6–23)
CO2: 23 mEq/L (ref 19–32)
Calcium: 9.6 mg/dL (ref 8.4–10.5)
Chloride: 103 mEq/L (ref 96–112)
Creatinine, Ser: 1.17 mg/dL (ref 0.40–1.50)
GFR: 70.6 mL/min (ref >60.00)
Glucose, Bld: 117 mg/dL — ABNORMAL HIGH (ref 70–99)
Potassium: 4 mEq/L (ref 3.5–5.1)
Sodium: 137 mEq/L (ref 135–145)
Total Bilirubin: 0.7 mg/dL (ref 0.2–1.2)
Total Protein: 8.1 g/dL (ref 6.0–8.3)

## 2022-07-31 MED ORDER — ESOMEPRAZOLE MAGNESIUM 20 MG PO CPDR: 20.0000 mg | DELAYED_RELEASE_CAPSULE | Freq: Every day | ORAL | 3 refills | Status: DC

## 2022-07-31 MED ORDER — METFORMIN HCL 1000 MG PO TABS: 1000.0000 mg | ORAL_TABLET | Freq: Two times a day (BID) | ORAL | 3 refills | Status: DC

## 2022-07-31 MED ORDER — FLURBIPROFEN 100 MG PO TABS: 50.0000 mg | ORAL_TABLET | Freq: Two times a day (BID) | ORAL | 3 refills | Status: DC | PRN

## 2022-07-31 NOTE — Patient Instructions (Signed)
Get diabetic eye exam done and have faxed to [place] at [phone].   Please stop by lab before you go If you have mychart- we will send your results within 3 business days of [place] receiving them.  If you do not have mychart- we will call you about results within 5 business days of [place] receiving them.  *please also note that you will see labs on mychart as soon as they post. I will later go in and write notes on them- will say "notes from Dr. Yong Channel"   Recommended follow up: Return in about 6 months (around 01/29/2023) for physical or sooner if needed.Schedule b4 you leave.

## 2022-07-31 NOTE — Progress Notes (Signed)
Phone [phone] In person visit   Subjective:   Trevor Flowers is a 56 y.o. year old very pleasant male patient who presents for/with See problem oriented charting Chief Complaint  Patient presents with   Follow-up   Diabetes   Past Medical History-  Patient Active Problem List   Diagnosis Date Noted   Diabetes mellitus type II, controlled ([place]) 06/09/2016    Priority: High   History of adenomatous polyp of colon 03/20/2018    Priority: Medium    Essential hypertension 11/13/2010    Priority: Medium    Sleep apnea 01/07/2009    Priority: Medium    Hyperlipidemia associated with type 2 diabetes mellitus ([place]) 01/08/2008    Priority: Medium    Migraine 12/04/2007    Priority: Medium    Former smoker 06/03/2015    Priority: Low   Allergic rhinitis 12/24/2014    Priority: Low   GERD (gastroesophageal reflux disease) 11/14/1999    Priority: Low   Quadriceps tendinitis 11/17/2015    Medications- reviewed and updated Current Outpatient Medications  Medication Sig Dispense Refill   esomeprazole (NEXIUM) 20 MG capsule Take 1 capsule (20 mg total) by mouth daily. 90 capsule 3   flurbiprofen (ANSAID) 100 MG tablet Take 0.5 tablets (50 mg total) by mouth 2 (two) times daily as needed (migraines). 30 tablet 3   lisinopril-hydrochlorothiazide (ZESTORETIC) 20-25 MG tablet TAKE 1 TABLET BY MOUTH EVERY DAY 90 tablet 1   loratadine (CLARITIN) 10 MG tablet Take 10 mg by mouth daily.     metFORMIN (GLUCOPHAGE) 1000 MG tablet Take 1 tablet (1,000 mg total) by mouth 2 (two) times daily with a meal. 180 tablet 3   rosuvastatin (CRESTOR) 40 MG tablet TAKE 1 TABLET BY MOUTH EVERY DAY 90 tablet 3   No current facility-administered medications for this visit.     Objective:  BP 112/70   Pulse 70   Temp 98.2 F (36.8 C)   Ht '6\' 4"'$  (1.93 m)   Wt 239 lb 12.8 oz (108.8 kg)   SpO2 97%   BMI 29.19 kg/m  Gen: NAD, resting comfortably CV: RRR no murmurs rubs or gallops Lungs: CTAB  no crackles, wheeze, rhonchi Ext: no edema Skin: warm, dry  Diabetic Foot Exam - Simple   Simple Foot Form Diabetic Foot exam was performed with the following findings: Yes 07/31/2022 10:59 AM  Visual Inspection No deformities, no ulcerations, no other skin breakdown bilaterally: Yes Sensation Testing Intact to touch and monofilament testing bilaterally: Yes Pulse Check Posterior Tibialis and Dorsalis pulse intact bilaterally: Yes Comments         Assessment and Plan   # Diabetes S: Medication:Metformin 1000 mg twice daily (more consistent other than a period where he ran out)  - reflecting back was missing some pills and some water intake and perhaps a few more alcoholic beverages- doing better on water and cut down on alcohol (maybe 4 shots a week) CBGs- 135 this AM to compare his machine to ours. Has seen some #s in 90s and 100 Exercise and diet-eating has improved "sometimes", 1.5 miles walking most days Lab Results  Component Value Date   HGBA1C 7.4 (H) 01/23/2022   HGBA1C 6.9 (H) 07/05/2021   HGBA1C 7.6 (H) 09/21/2020  A/P: hopefully stable- update a1c today. Continue current meds for now  #hyperlipidemia-trig over 400- prefer fasting labs S: Medication: rosuvastatin 40 mg daily Lab Results  Component Value Date   CHOL 150 01/23/2022   HDL 39.00 (L) 01/23/2022  LDLCALC 73 01/23/2022   LDLDIRECT 91.0 07/05/2021   TRIG 190.0 (H) 01/23/2022   CHOLHDL 4 01/23/2022   A/P: Close to ideal control-continue current medication  #hypertension S: medication: Zestoretic 20-25 mg Home readings #s: not checking recently but has cuff  BP Readings from Last 3 Encounters:  07/31/22 112/70  01/23/22 120/70  07/05/21 129/80   A/P: Controlled. Continue current medications.   # GERD S:Medication:  Nexium 20 mg (once a month or so- eating better helps) and an immune pill that has B-12 in it A/P: failed pepcid in past- continue current meds   #Migraines-sparing flurbiprofen.   has had a hard time getting insurance to cover- send in again and try good rx   #OSA-compliant with CPAP  Recommended follow up: Return in about 6 months (around 01/29/2023) for physical or sooner if needed.Schedule b4 you leave.  Lab/Order associations:   ICD-10-CM   1. Controlled type 2 diabetes mellitus without complication, without long-term current use of insulin (HCC)  E11.9 Hemoglobin A1c    Microalbumin / creatinine urine ratio    Comprehensive metabolic panel    2. Essential hypertension  I10      Meds ordered this encounter  Medications   metFORMIN (GLUCOPHAGE) 1000 MG tablet    Sig: Take 1 tablet (1,000 mg total) by mouth 2 (two) times daily with a meal.    Dispense:  180 tablet    Refill:  3   esomeprazole (NEXIUM) 20 MG capsule    Sig: Take 1 capsule (20 mg total) by mouth daily.    Dispense:  90 capsule    Refill:  3   flurbiprofen (ANSAID) 100 MG tablet    Sig: Take 0.5 tablets (50 mg total) by mouth 2 (two) times daily as needed (migraines).    Dispense:  30 tablet    Refill:  3   Return precautions advised.  Garret Reddish, MD

## 2022-10-27 ENCOUNTER — Other Ambulatory Visit: Payer: Self-pay | Admitting: Family Medicine

## 2023-02-19 ENCOUNTER — Encounter: Payer: Self-pay | Admitting: Family Medicine

## 2023-02-20 ENCOUNTER — Other Ambulatory Visit: Payer: Self-pay | Admitting: Family Medicine

## 2023-03-05 ENCOUNTER — Encounter: Payer: Self-pay | Admitting: Family Medicine

## 2023-03-05 ENCOUNTER — Ambulatory Visit (INDEPENDENT_AMBULATORY_CARE_PROVIDER_SITE_OTHER): Payer: 59 | Admitting: Family Medicine

## 2023-03-05 VITALS — BP 132/78 | HR 96 | Temp 98.3°F | Ht 76.0 in | Wt 250.8 lb

## 2023-03-05 DIAGNOSIS — Z0000 Encounter for general adult medical examination without abnormal findings: Secondary | ICD-10-CM

## 2023-03-05 DIAGNOSIS — E1169 Type 2 diabetes mellitus with other specified complication: Secondary | ICD-10-CM

## 2023-03-05 DIAGNOSIS — Z125 Encounter for screening for malignant neoplasm of prostate: Secondary | ICD-10-CM | POA: Diagnosis not present

## 2023-03-05 DIAGNOSIS — E119 Type 2 diabetes mellitus without complications: Secondary | ICD-10-CM

## 2023-03-05 DIAGNOSIS — Z86010 Personal history of colonic polyps: Secondary | ICD-10-CM

## 2023-03-05 DIAGNOSIS — E785 Hyperlipidemia, unspecified: Secondary | ICD-10-CM

## 2023-03-05 DIAGNOSIS — Z1211 Encounter for screening for malignant neoplasm of colon: Secondary | ICD-10-CM

## 2023-03-05 DIAGNOSIS — Z87891 Personal history of nicotine dependence: Secondary | ICD-10-CM

## 2023-03-05 MED ORDER — ESOMEPRAZOLE MAGNESIUM 20 MG PO CPDR: 20.0000 mg | DELAYED_RELEASE_CAPSULE | Freq: Every day | ORAL | 3 refills | Status: AC

## 2023-03-05 NOTE — Patient Instructions (Addendum)
[facility] contact Please call to schedule visit and/or procedure Address: [address] [place], [place], [place] [address] Phone: [phone]   Schedule a lab visit at the check out desk within 2 weeks. Return for future fasting labs meaning nothing but water after midnight please. Ok to take your medications with water.

## 2023-03-05 NOTE — Progress Notes (Signed)
Phone: [phone]   Subjective:  Patient presents today for their annual physical. Chief complaint-noted.   See problem oriented charting- ROS- full  review of systems was completed and negative  except for: seasonal allergies- sinus pressure, sneezing, headaches, joint pain  The following were reviewed and entered/updated in epic: Past Medical History:  Diagnosis Date   Allergy    GERD (gastroesophageal reflux disease)    HYPERLIPIDEMIA 01/08/2008   Hypertension    Migraine 12/04/2007   Sleep apnea    uses cpap   TRANSAMINASES, SERUM, ELEVATED 01/08/2008   resolved on repeat   Patient Active Problem List   Diagnosis Date Noted   Diabetes mellitus type II, controlled 06/09/2016    Priority: High   History of adenomatous polyp of colon 03/20/2018    Priority: Medium    Essential hypertension 11/13/2010    Priority: Medium    Sleep apnea 01/07/2009    Priority: Medium    Hyperlipidemia associated with type 2 diabetes mellitus 01/08/2008    Priority: Medium    Migraine 12/04/2007    Priority: Medium    Former smoker 06/03/2015    Priority: Low   Allergic rhinitis 12/24/2014    Priority: Low   GERD (gastroesophageal reflux disease) 11/14/1999    Priority: Low   Quadriceps tendinitis 11/17/2015   Past Surgical History:  Procedure Laterality Date   none      Family History  Problem Relation Age of Onset   Hypertension Mother    Other Father        unknown history   Asthma Sister    Colon cancer Neg Hx    Colon polyps Neg Hx    Esophageal cancer Neg Hx    Rectal cancer Neg Hx    Stomach cancer Neg Hx     Medications- reviewed and updated Current Outpatient Medications  Medication Sig Dispense Refill   flurbiprofen (ANSAID) 100 MG tablet Take 0.5 tablets (50 mg total) by mouth 2 (two) times daily as needed (migraines). 30 tablet 3   lisinopril-hydrochlorothiazide (ZESTORETIC) 20-25 MG tablet TAKE 1 TABLET BY MOUTH EVERY DAY 90 tablet 1   loratadine  (CLARITIN) 10 MG tablet Take 10 mg by mouth daily.     metFORMIN (GLUCOPHAGE) 1000 MG tablet Take 1 tablet (1,000 mg total) by mouth 2 (two) times daily with a meal. 180 tablet 3   rosuvastatin (CRESTOR) 40 MG tablet TAKE 1 TABLET BY MOUTH EVERY DAY 90 tablet 3   esomeprazole (NEXIUM) 20 MG capsule Take 1 capsule (20 mg total) by mouth daily. 90 capsule 3   No current facility-administered medications for this visit.    Allergies-reviewed and updated Allergies  Allergen Reactions   Mold Extract [Trichophyton]     Social History   Social History Narrative   Divorced since 2017 - ex was on drugs. 2024 broke up with girlfriend. Son and daughter 33 and 68 in 2021.       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 Oropharynx normal Neck: no thyromegaly CV: RRR no murmurs rubs or gallops  Lungs: CTAB no crackles, wheeze, rhonchi Abdomen: soft/nontender/nondistended/normal bowel sounds. No rebound or guarding.  Ext: no edema Skin: warm, dry Neuro: grossly normal, moves all extremities, PERRLA   Assessment and Plan  57 y.o. male presenting for annual physical.  Health Maintenance counseling: 1. Anticipatory guidance: Patient counseled regarding regular dental exams -q6 months, eye exams - requesting copy of yearly-reports misses some,  avoiding smoking and second hand smoke , limiting alcohol to 2 beverages per day -generally under this, no illicit drugs .   2. Risk factor reduction:  Advised patient of need for regular exercise and diet rich and fruits and vegetables to reduce risk of heart attack and stroke.  Exercise- got weights but  daughter moved everything out and not accessible now- walking some- encouraged 150 minutes a week exercise.  Diet/weight management-down 4 lbs from last year but got even lower in September down to 239- continue to work towards that again- reports not eating well- some stress from recent breakfup.  Wt Readings from Last 3 Encounters:  03/05/23 250 lb 12.8 oz (113.8 kg)  07/31/22 239 lb 12.8 oz (108.8 kg)  01/23/22 254 lb 8 oz (115.4 kg)  3. Immunizations/screenings/ancillary studies- HIV screen - opts out  Immunization History  Administered Date(s) Administered   Moderna Sars-Covid-2 Vaccination 05/27/2020, 07/08/2020   Pneumococcal Polysaccharide-23 07/19/2018   Td 12/15/2007   Tdap 01/08/2008, 04/12/2018  4. Prostate cancer screening-  low risk prior trend- udpate with labs Lab Results  Component Value Date   PSA 0.99 01/23/2022   PSA 0.93 09/21/2020   PSA 1.17 12/31/2017   5. Colon cancer screening - 03/14/2018 with plan for 5 year repeat-due next month - refer today 6. Skin cancer screening-low risk due to melanin content. advised regular sunscreen use. Denies worrisome, changing, or new skin lesions.   7. Smoking associated screening (lung cancer screening, AAA screen 65-75, UA)- former smoker- 22 pack years and quit in 2008. outside of 15 years now-no longer qualifies for lung cancer screening program We will check urinalysis today. 8. STD screening - opts out as monogamous previously- no concern infidelity- though with new girlfriend - not sexually active yet    Status of chronic or acute concerns   # Diabetes S: Medication:Metformin 1000 mg twice daily- pretty consistent- thinks may get  mild headache(s) with this at times - discussed restarting glimepiride if sugars got above 125  CBGs- 130-140 in morning, evenings as low as 78-90 Exercise and diet- encouraged him to get back on a healthy diet Lab Results  Component Value Date   HGBA1C 7.1 (H) 07/31/2022   HGBA1C 7.4 (H)  01/23/2022   HGBA1C 6.9 (H) 07/05/2021  A/P: hopefully stable- update a1c today. Continue current meds for now - we did consider changing to ER version of metformin. He's interested in other options but may take more than 1 medicine which he prefers to avoid and wants to avoid needles definitely  #hyperlipidemia-trig over 400- prefer fasting labs S: Medication: rosuvastatin 40 mg daily Lab Results  Component Value Date   CHOL 150 01/23/2022   HDL 39.00 (L) 01/23/2022   LDLCALC 73 01/23/2022   LDLDIRECT 91.0 07/05/2021   TRIG 190.0 (H) 01/23/2022   CHOLHDL 4 01/23/2022   A/P: close to ideal goal- continue current medications for now - update lipids  #hypertension S: medication: Zestoretic 20-25 mg Home readings #s: needs batteries BP Readings from Last 3 Encounters:  03/05/23 132/78  07/31/22 112/70  01/23/22 120/70  A/P: stable (will  tolerate <135/85)- continue current medicines   # GERD S:Medication:  Nexium 20 mg and an immune pill that has B-12 in it -has failed pepcid in past A/P: stable- continue current medicines    #Migraines-sparing flurbiprofen once a month.  Willing to refill if needed.  In the past was filled by Dr. Vela Prose who has retired in recent years.    #OSA-compliant with CPAP. Works with [employer]. Consistent with use - he may need updated parts. Uses most nights for 6-8 hours- feels more refreshed when he uses machine  Recommended follow up: Return in about 6 months (around 09/04/2023) for followup or sooner if needed.Schedule b4 you leave.  Lab/Order associations: fasting   ICD-10-CM   1. Preventative health care  Z00.00 CBC with Differential/Platelet    Comprehensive metabolic panel    Lipid panel    PSA    HgB A1c    Urinalysis, Routine w reflex microscopic    2. Hyperlipidemia associated with type 2 diabetes mellitus Chronic E11.69    E78.5     3. Controlled type 2 diabetes mellitus without complication, without long-term current use of  insulin  E11.9 CBC with Differential/Platelet    Comprehensive metabolic panel    Lipid panel    HgB A1c    4. Screening for prostate cancer  Z12.5 PSA    5. Former smoker  Z87.891 Urinalysis, Routine w reflex microscopic    6. Screen for colon cancer  Z12.11 Ambulatory referral to Gastroenterology    7. History of colon polyps  Z86.010       Meds ordered this encounter  Medications   esomeprazole (NEXIUM) 20 MG capsule    Sig: Take 1 capsule (20 mg total) by mouth daily.    Dispense:  90 capsule    Refill:  3    Return precautions advised.  Tana Conch, MD

## 2023-03-12 ENCOUNTER — Other Ambulatory Visit: Payer: 59

## 2023-04-17 ENCOUNTER — Encounter: Payer: Self-pay | Admitting: Internal Medicine

## 2023-07-23 ENCOUNTER — Other Ambulatory Visit: Payer: Self-pay | Admitting: Family Medicine

## 2023-09-17 ENCOUNTER — Ambulatory Visit: Payer: 59 | Admitting: Family Medicine

## 2023-10-15 ENCOUNTER — Ambulatory Visit: Payer: 59 | Admitting: Family Medicine

## 2023-10-19 ENCOUNTER — Other Ambulatory Visit: Payer: Self-pay | Admitting: Family Medicine

## 2023-11-16 ENCOUNTER — Other Ambulatory Visit: Payer: Self-pay | Admitting: Family Medicine

## 2023-12-10 ENCOUNTER — Ambulatory Visit (INDEPENDENT_AMBULATORY_CARE_PROVIDER_SITE_OTHER): Payer: Self-pay | Admitting: Family Medicine

## 2023-12-10 ENCOUNTER — Encounter: Payer: Self-pay | Admitting: Family Medicine

## 2023-12-10 VITALS — BP 120/70 | HR 93 | Temp 97.9°F | Ht 76.0 in | Wt 243.2 lb

## 2023-12-10 DIAGNOSIS — E1169 Type 2 diabetes mellitus with other specified complication: Secondary | ICD-10-CM

## 2023-12-10 DIAGNOSIS — E785 Hyperlipidemia, unspecified: Secondary | ICD-10-CM

## 2023-12-10 DIAGNOSIS — G4730 Sleep apnea, unspecified: Secondary | ICD-10-CM

## 2023-12-10 DIAGNOSIS — E119 Type 2 diabetes mellitus without complications: Secondary | ICD-10-CM

## 2023-12-10 DIAGNOSIS — Z7984 Long term (current) use of oral hypoglycemic drugs: Secondary | ICD-10-CM

## 2023-12-10 DIAGNOSIS — I10 Essential (primary) hypertension: Secondary | ICD-10-CM

## 2023-12-10 MED ORDER — METFORMIN HCL 1000 MG PO TABS: 1000.0000 mg | ORAL_TABLET | Freq: Two times a day (BID) | ORAL | 3 refills | Status: DC

## 2023-12-10 NOTE — Progress Notes (Signed)
Phone [phone] In person visit   Subjective:   Trevor Flowers is a 58 y.o. year old very pleasant male patient who presents for/with See problem oriented charting Chief Complaint  Patient presents with   Medical Management of Chronic Issues   Diabetes    Pt has been without insurance so he has not been able to get DEE done nor meds refilled.    Past Medical History-  Patient Active Problem List   Diagnosis Date Noted   Diabetes mellitus type II, controlled ([facility]) 06/09/2016    Priority: High   History of adenomatous polyp of colon 03/20/2018    Priority: Medium    Essential hypertension 11/13/2010    Priority: Medium    Sleep apnea 01/07/2009    Priority: Medium    Hyperlipidemia associated with type 2 diabetes mellitus (HCC) 01/08/2008    Priority: Medium    Migraine 12/04/2007    Priority: Medium    Former smoker 06/03/2015    Priority: Low   Allergic rhinitis 12/24/2014    Priority: Low   GERD (gastroesophageal reflux disease) 11/14/1999    Priority: Low   Quadriceps tendinitis 11/17/2015    Medications- reviewed and updated Current Outpatient Medications  Medication Sig Dispense Refill   esomeprazole (NEXIUM) 20 MG capsule Take 1 capsule (20 mg total) by mouth daily. 90 capsule 3   flurbiprofen (ANSAID) 100 MG tablet Take 0.5 tablets (50 mg total) by mouth 2 (two) times daily as needed (migraines). 30 tablet 3   lisinopril-hydrochlorothiazide (ZESTORETIC) 20-25 MG tablet TAKE 1 TABLET BY MOUTH EVERY DAY 90 tablet 1   loratadine (CLARITIN) 10 MG tablet Take 10 mg by mouth daily.     rosuvastatin (CRESTOR) 40 MG tablet TAKE 1 TABLET BY MOUTH EVERY DAY 90 tablet 3   metFORMIN (GLUCOPHAGE) 1000 MG tablet Take 1 tablet (1,000 mg total) by mouth 2 (two) times daily with a meal. 180 tablet 3   No current facility-administered medications for this visit.     Objective:  BP 120/70   Pulse 93   Temp 97.9 F (36.6 C)   Ht 6\' 4"  (1.93 m)   Wt 243 lb 3.2 oz  (110.3 kg)   SpO2 97%   BMI 29.60 kg/m  Gen: NAD, resting comfortably CV: RRR no murmurs rubs or gallops Lungs: CTAB no crackles, wheeze, rhonchi Ext: no edema Skin: warm, dry     Assessment and Plan   #social update- out of work for a month and out of medicine- thinks sugars could be higher- moving less in the cold  # Diabetes S: Medication:Metformin 1000 mg twice daily  in past but had run out CBGs- 166 this morning, then up to 200 later in day. In December with activity was running around 100 Exercise and diet- not eating as well in cold weather. Limiting fried foods  Lab Results  Component Value Date   HGBA1C 7.1 (H) 07/31/2022   HGBA1C 7.4 (H) 01/23/2022   HGBA1C 6.9 (H) 07/05/2021  A/P: Diabetes with unknown status- waiting on a1c today- restart metformin 1000 mg- suspect will be controlled back on this  #hyperlipidemia-trig over 400- prefer fasting labs S: Medication: rosuvastatin 40 mg daily in past- has been out for a month Lab Results  Component Value Date   CHOL 150 01/23/2022   HDL 39.00 (L) 01/23/2022   LDLCALC 73 01/23/2022   LDLDIRECT 91.0 07/05/2021   TRIG 190.0 (H) 01/23/2022   CHOLHDL 4 01/23/2022   A/P: we want to restart  this but need to prioritize glycemic control with financial situation- start with that and blood pressure control as well then circle back to this  #hypertension S: medication: prior on Zestoretic 20-25 mg BP Readings from Last 3 Encounters:  12/10/23 120/70  03/05/23 132/78  07/31/22 112/70  A/P: blood pressure controlled even without medicine today- monitor off of medicien but leave on list for now   # GERD S:Medication:  Nexium 20 mg sparingly A/P: better control lately -only flares with certain foods   #Migraines-sparing flurbiprofen.  Willing to refill if needed but still has some  In the past was filled by Dr. Vela Prose who has retired in recent years. Rarely using .    #OSA-compliant with CPAP only 2-3 days a  week  Recommended follow up: Return in about 4 months (around 04/08/2024) for followup or sooner if needed.Schedule b4 you leave.   Lab/Order associations:   ICD-10-CM   1. Controlled type 2 diabetes mellitus without complication, without long-term current use of insulin (HCC)  E11.9 Comprehensive metabolic panel    Hemoglobin A1c      Meds ordered this encounter  Medications   metFORMIN (GLUCOPHAGE) 1000 MG tablet    Sig: Take 1 tablet (1,000 mg total) by mouth 2 (two) times daily with a meal.    Dispense:  180 tablet    Refill:  3    Return precautions advised.  Tana Conch, MD

## 2023-12-10 NOTE — Patient Instructions (Addendum)
Please stop by lab before you go If you have mychart- we will send your results within 3 business days of [place] receiving them.  If you do not have mychart- we will call you about results within 5 business days of [place] receiving them.  *please also note that you will see labs on mychart as soon as they post. I will later go in and write notes on them- will say "notes from Dr. Durene Cal"   Take metformin to [facility]- should be able to get 90 days for $10   Recommended follow up: Return in about 4 months (around 04/08/2024) for followup or sooner if needed.Schedule b4 you leave.

## 2023-12-11 ENCOUNTER — Encounter: Payer: Self-pay | Admitting: Family Medicine

## 2023-12-11 LAB — COMPREHENSIVE METABOLIC PANEL
ALT: 58 U/L — ABNORMAL HIGH (ref 0–53)
AST: 43 U/L — ABNORMAL HIGH (ref 0–37)
Albumin: 5.1 g/dL (ref 3.5–5.2)
Alkaline Phosphatase: 52 U/L (ref 39–117)
BUN: 18 mg/dL (ref 6–23)
CO2: 26 meq/L (ref 19–32)
Calcium: 10.5 mg/dL (ref 8.4–10.5)
Chloride: 93 meq/L — ABNORMAL LOW (ref 96–112)
Creatinine, Ser: 1.2 mg/dL (ref 0.40–1.50)
GFR: 67.83 mL/min (ref >60.00)
Glucose, Bld: 190 mg/dL — ABNORMAL HIGH (ref 70–99)
Potassium: 3.6 meq/L (ref 3.5–5.1)
Sodium: 130 meq/L — ABNORMAL LOW (ref 135–145)
Total Bilirubin: 0.9 mg/dL (ref 0.2–1.2)
Total Protein: 8.6 g/dL — ABNORMAL HIGH (ref 6.0–8.3)

## 2023-12-11 LAB — HEMOGLOBIN A1C: Hgb A1c MFr Bld: 8.7 % — ABNORMAL HIGH (ref 4.6–6.5)

## 2024-02-13 ENCOUNTER — Other Ambulatory Visit: Payer: Self-pay | Admitting: Family Medicine

## 2024-05-15 ENCOUNTER — Other Ambulatory Visit: Payer: Self-pay | Admitting: Family Medicine

## 2024-12-04 ENCOUNTER — Other Ambulatory Visit: Payer: Self-pay | Admitting: Family Medicine
